# Patient Record
Sex: Female | Born: 1935 | Race: White | Hispanic: No | Smoking: Never smoker
Health system: Southern US, Community
[De-identification: ages and names within clinical notes are randomized; demographics above are authoritative.]

## PROBLEM LIST (undated history)

## (undated) DIAGNOSIS — L03115 Cellulitis of right lower limb: Secondary | ICD-10-CM

## (undated) DIAGNOSIS — I10 Essential (primary) hypertension: Secondary | ICD-10-CM

## (undated) DIAGNOSIS — K219 Gastro-esophageal reflux disease without esophagitis: Secondary | ICD-10-CM

## (undated) DIAGNOSIS — R269 Unspecified abnormalities of gait and mobility: Secondary | ICD-10-CM

## (undated) DIAGNOSIS — F0390 Unspecified dementia without behavioral disturbance: Secondary | ICD-10-CM

## (undated) DIAGNOSIS — M1990 Unspecified osteoarthritis, unspecified site: Secondary | ICD-10-CM

## (undated) DIAGNOSIS — L03116 Cellulitis of left lower limb: Secondary | ICD-10-CM

## (undated) DIAGNOSIS — E785 Hyperlipidemia, unspecified: Secondary | ICD-10-CM

## (undated) DIAGNOSIS — I509 Heart failure, unspecified: Secondary | ICD-10-CM

## (undated) HISTORY — PX: JOINT REPLACEMENT: SHX530

---

## 2005-02-22 ENCOUNTER — Ambulatory Visit: Payer: Self-pay | Admitting: Internal Medicine

## 2005-05-08 ENCOUNTER — Ambulatory Visit: Payer: Self-pay | Admitting: Internal Medicine

## 2006-04-23 ENCOUNTER — Ambulatory Visit: Payer: Self-pay | Admitting: Internal Medicine

## 2006-08-26 ENCOUNTER — Ambulatory Visit: Payer: Self-pay | Admitting: Internal Medicine

## 2007-01-14 ENCOUNTER — Encounter: Payer: Self-pay | Admitting: Internal Medicine

## 2007-01-14 LAB — CONVERTED CEMR LAB
AST: 19 units/L (ref 0–37)
Albumin ELP: 58.9 % (ref 55.8–66.1)
Albumin: 3.8 g/dL (ref 3.5–5.2)
Alkaline Phosphatase: 54 units/L (ref 39–117)
Alpha-1-Globulin: 6.1 % — ABNORMAL HIGH (ref 2.9–4.9)
Alpha-2-Globulin: 13.6 % — ABNORMAL HIGH (ref 7.1–11.8)
Basophils Absolute: 0.1 10*3/uL (ref 0.0–0.1)
Basophils Relative: 0.9 % (ref 0.0–1.0)
Beta Globulin: 6.7 % (ref 4.7–7.2)
Bilirubin, Direct: 0.1 mg/dL (ref 0.0–0.3)
CO2: 31 meq/L (ref 19–32)
Chloride: 97 meq/L (ref 96–112)
Eosinophils Absolute: 0.4 10*3/uL (ref 0.0–0.6)
GFR calc non Af Amer: 73 mL/min
Gamma Globulin: 10.3 % — ABNORMAL LOW (ref 11.1–18.8)
Glucose, Bld: 100 mg/dL — ABNORMAL HIGH (ref 70–99)
HCT: 34.6 % — ABNORMAL LOW (ref 36.0–46.0)
HDL: 57.6 mg/dL (ref >39.0)
Hemoglobin: 11.7 g/dL — ABNORMAL LOW (ref 12.0–15.0)
IgA: 231 mg/dL (ref 68–378)
Lymphocytes Relative: 25.6 % (ref 12.0–46.0)
Monocytes Relative: 8.5 % (ref 3.0–11.0)
Neutrophils Relative %: 60.4 % (ref 43.0–77.0)
RBC: 3.6 M/uL — ABNORMAL LOW (ref 3.87–5.11)
Sed Rate: 37 mm/hr — ABNORMAL HIGH (ref 0–25)
Total Bilirubin: 0.7 mg/dL (ref 0.3–1.2)
Total CHOL/HDL Ratio: 3
VLDL: 27 mg/dL (ref 0–40)

## 2007-06-15 ENCOUNTER — Ambulatory Visit: Payer: Self-pay | Admitting: Internal Medicine

## 2007-06-16 ENCOUNTER — Inpatient Hospital Stay (HOSPITAL_COMMUNITY): Admission: EM | Admit: 2007-06-16 | Discharge: 2007-06-20 | Payer: Self-pay | Admitting: Emergency Medicine

## 2007-06-19 ENCOUNTER — Ambulatory Visit: Payer: Self-pay | Admitting: Physical Medicine & Rehabilitation

## 2007-08-11 ENCOUNTER — Ambulatory Visit: Payer: Self-pay | Admitting: Internal Medicine

## 2007-08-14 DIAGNOSIS — I10 Essential (primary) hypertension: Secondary | ICD-10-CM | POA: Insufficient documentation

## 2007-08-14 DIAGNOSIS — E785 Hyperlipidemia, unspecified: Secondary | ICD-10-CM

## 2007-08-14 DIAGNOSIS — R609 Edema, unspecified: Secondary | ICD-10-CM

## 2007-08-14 DIAGNOSIS — M1990 Unspecified osteoarthritis, unspecified site: Secondary | ICD-10-CM

## 2007-08-25 ENCOUNTER — Encounter: Payer: Self-pay | Admitting: Internal Medicine

## 2007-09-03 ENCOUNTER — Ambulatory Visit: Payer: Self-pay | Admitting: Internal Medicine

## 2007-09-03 DIAGNOSIS — M25519 Pain in unspecified shoulder: Secondary | ICD-10-CM | POA: Insufficient documentation

## 2007-11-28 ENCOUNTER — Telehealth: Payer: Self-pay | Admitting: Internal Medicine

## 2007-12-20 ENCOUNTER — Encounter: Payer: Self-pay | Admitting: *Deleted

## 2007-12-20 DIAGNOSIS — R32 Unspecified urinary incontinence: Secondary | ICD-10-CM | POA: Insufficient documentation

## 2007-12-31 ENCOUNTER — Ambulatory Visit: Payer: Self-pay | Admitting: Internal Medicine

## 2007-12-31 LAB — CONVERTED CEMR LAB
BUN: 17 mg/dL (ref 6–23)
Calcium: 9.9 mg/dL (ref 8.4–10.5)
Cholesterol: 217 mg/dL (ref 0–200)
Creatinine, Ser: 0.9 mg/dL (ref 0.4–1.2)
HDL: 58.8 mg/dL (ref >39.0)
Sodium: 136 meq/L (ref 135–145)
Total CHOL/HDL Ratio: 3.7
Total Protein: 6.8 g/dL (ref 6.0–8.3)
Triglycerides: 113 mg/dL (ref 0–149)
VLDL: 23 mg/dL (ref 0–40)

## 2008-01-03 ENCOUNTER — Encounter: Payer: Self-pay | Admitting: Internal Medicine

## 2008-08-05 ENCOUNTER — Ambulatory Visit: Payer: Self-pay | Admitting: Internal Medicine

## 2008-08-05 LAB — CONVERTED CEMR LAB
BUN: 22 mg/dL (ref 6–23)
Basophils Absolute: 0 10*3/uL (ref 0.0–0.1)
Basophils Relative: 0.7 % (ref 0.0–3.0)
CO2: 31 meq/L (ref 19–32)
Calcium: 9.3 mg/dL (ref 8.4–10.5)
Chloride: 100 meq/L (ref 96–112)
Cholesterol: 277 mg/dL (ref 0–200)
Creatinine, Ser: 1.2 mg/dL (ref 0.4–1.2)
Eosinophils Absolute: 0.2 10*3/uL (ref 0.0–0.7)
Eosinophils Relative: 2.9 % (ref 0.0–5.0)
GFR calc Af Amer: 55 mL/min
HCT: 30.4 % — ABNORMAL LOW (ref 36.0–46.0)
Lymphocytes Relative: 19.2 % (ref 12.0–46.0)
MCHC: 33.8 g/dL (ref 30.0–36.0)
Monocytes Absolute: 0.6 10*3/uL (ref 0.1–1.0)
Monocytes Relative: 9.4 % (ref 3.0–12.0)
Neutro Abs: 4.4 10*3/uL (ref 1.4–7.7)
Platelets: 316 10*3/uL (ref 150–400)
Potassium: 4.6 meq/L (ref 3.5–5.1)
RDW: 11.6 % (ref 11.5–14.6)
Total CHOL/HDL Ratio: 5.7
WBC: 6.4 10*3/uL (ref 4.5–10.5)

## 2008-08-08 ENCOUNTER — Encounter: Payer: Self-pay | Admitting: Internal Medicine

## 2008-09-15 ENCOUNTER — Telehealth: Payer: Self-pay | Admitting: Internal Medicine

## 2008-12-14 ENCOUNTER — Ambulatory Visit: Payer: Self-pay | Admitting: Internal Medicine

## 2008-12-14 DIAGNOSIS — J019 Acute sinusitis, unspecified: Secondary | ICD-10-CM

## 2008-12-14 DIAGNOSIS — R252 Cramp and spasm: Secondary | ICD-10-CM

## 2008-12-14 DIAGNOSIS — D649 Anemia, unspecified: Secondary | ICD-10-CM

## 2008-12-14 DIAGNOSIS — H1030 Unspecified acute conjunctivitis, unspecified eye: Secondary | ICD-10-CM | POA: Insufficient documentation

## 2009-01-17 ENCOUNTER — Ambulatory Visit: Payer: Self-pay | Admitting: Internal Medicine

## 2009-01-17 DIAGNOSIS — H906 Mixed conductive and sensorineural hearing loss, bilateral: Secondary | ICD-10-CM | POA: Insufficient documentation

## 2009-01-17 LAB — CONVERTED CEMR LAB
Albumin: 4 g/dL (ref 3.5–5.2)
Alkaline Phosphatase: 54 units/L (ref 39–117)
BUN: 21 mg/dL (ref 6–23)
CO2: 26 meq/L (ref 19–32)
Calcium: 9.7 mg/dL (ref 8.4–10.5)
Creatinine, Ser: 1.3 mg/dL — ABNORMAL HIGH (ref 0.4–1.2)
GFR calc non Af Amer: 41.66 mL/min (ref >60)
LDL Cholesterol: 108 mg/dL — ABNORMAL HIGH (ref 0–99)
Potassium: 4.7 meq/L (ref 3.5–5.1)
Sodium: 137 meq/L (ref 135–145)
Triglycerides: 133 mg/dL (ref 0.0–149.0)

## 2009-01-23 ENCOUNTER — Encounter: Payer: Self-pay | Admitting: Internal Medicine

## 2009-02-07 ENCOUNTER — Encounter: Payer: Self-pay | Admitting: Internal Medicine

## 2009-04-11 ENCOUNTER — Encounter: Payer: Self-pay | Admitting: Internal Medicine

## 2009-08-16 ENCOUNTER — Telehealth: Payer: Self-pay | Admitting: Internal Medicine

## 2010-01-23 ENCOUNTER — Ambulatory Visit: Payer: Self-pay | Admitting: Internal Medicine

## 2010-01-23 LAB — CONVERTED CEMR LAB
BUN: 22 mg/dL (ref 6–23)
Creatinine, Ser: 1.2 mg/dL (ref 0.4–1.2)
Eosinophils Absolute: 0.2 10*3/uL (ref 0.0–0.7)
Glucose, Bld: 98 mg/dL (ref 70–99)
Lymphocytes Relative: 27.7 % (ref 12.0–46.0)
Lymphs Abs: 2.1 10*3/uL (ref 0.7–4.0)
MCHC: 35.2 g/dL (ref 30.0–36.0)
MCV: 96.9 fL (ref 78.0–100.0)
Monocytes Absolute: 0.6 10*3/uL (ref 0.1–1.0)
Neutro Abs: 4.7 10*3/uL (ref 1.4–7.7)
Potassium: 5.1 meq/L (ref 3.5–5.1)
Sodium: 141 meq/L (ref 135–145)
TSH: 0.39 microintl units/mL (ref 0.35–5.50)

## 2010-03-30 ENCOUNTER — Ambulatory Visit: Payer: Self-pay | Admitting: Internal Medicine

## 2010-03-30 ENCOUNTER — Inpatient Hospital Stay (HOSPITAL_COMMUNITY): Admission: EM | Admit: 2010-03-30 | Discharge: 2010-04-04 | Payer: Self-pay | Admitting: Emergency Medicine

## 2010-04-03 ENCOUNTER — Ambulatory Visit: Payer: Self-pay | Admitting: Internal Medicine

## 2010-04-04 ENCOUNTER — Encounter: Payer: Self-pay | Admitting: Internal Medicine

## 2010-04-07 ENCOUNTER — Encounter: Payer: Self-pay | Admitting: Internal Medicine

## 2010-04-22 ENCOUNTER — Ambulatory Visit: Payer: Self-pay | Admitting: Internal Medicine

## 2010-04-27 ENCOUNTER — Ambulatory Visit: Payer: Self-pay | Admitting: Internal Medicine

## 2010-04-27 DIAGNOSIS — E538 Deficiency of other specified B group vitamins: Secondary | ICD-10-CM

## 2010-07-13 ENCOUNTER — Telehealth: Payer: Self-pay | Admitting: Internal Medicine

## 2010-07-25 ENCOUNTER — Ambulatory Visit: Payer: Self-pay | Admitting: Internal Medicine

## 2010-07-25 DIAGNOSIS — I509 Heart failure, unspecified: Secondary | ICD-10-CM | POA: Insufficient documentation

## 2010-07-25 LAB — CONVERTED CEMR LAB
BUN: 28 mg/dL — ABNORMAL HIGH (ref 6–23)
Basophils Absolute: 0 10*3/uL (ref 0.0–0.1)
Basophils Relative: 0.3 % (ref 0.0–3.0)
CO2: 27 meq/L (ref 19–32)
Calcium: 9.5 mg/dL (ref 8.4–10.5)
Folate: 11.4 ng/mL
GFR calc non Af Amer: 35.46 mL/min (ref >60)
HCT: 28.5 % — ABNORMAL LOW (ref 36.0–46.0)
Hemoglobin: 9.8 g/dL — ABNORMAL LOW (ref 12.0–15.0)
Iron: 101 ug/dL (ref 42–145)
MCV: 98.7 fL (ref 78.0–100.0)
Neutrophils Relative %: 67.6 % (ref 43.0–77.0)
RBC: 2.89 M/uL — ABNORMAL LOW (ref 3.87–5.11)
RDW: 13.4 % (ref 11.5–14.6)
Saturation Ratios: 35 % (ref 20.0–50.0)
Sodium: 136 meq/L (ref 135–145)
Transferrin: 206.1 mg/dL — ABNORMAL LOW (ref 212.0–360.0)
Vitamin B-12: 373 pg/mL (ref 211–911)

## 2010-07-26 ENCOUNTER — Encounter: Payer: Self-pay | Admitting: Internal Medicine

## 2010-09-20 ENCOUNTER — Ambulatory Visit: Payer: Self-pay | Admitting: Internal Medicine

## 2010-11-16 NOTE — Procedures (Signed)
Summary: Upper Endoscopy  Patient: [name] Note: All result statuses are Final unless otherwise noted.  Tests: (1) Upper Endoscopy (EGD)   EGD Upper Endoscopy       DONE     [facility]     [address] [place], [place]  [address]           ENDOSCOPY PROCEDURE REPORT           PATIENT:  [name], [name]  MR#:  [id number]     BIRTHDATE:  Jul 26, 1925, 85 yrs. old  GENDER:  female           ENDOSCOPIST:  Wilhemina Bonito. Eda Keys, MD     Referred by:  Rosalyn Gess. Norins, M.D.           PROCEDURE DATE:  04/04/2010     PROCEDURE:  EGD, diagnostic     ASA CLASS:  Class II     INDICATIONS:  iron deficiency anemia, GERD           MEDICATIONS:   There was residual sedation effect present from     prior procedure., Versed 1 mg IV     TOPICAL ANESTHETIC:  Cetacaine Spray           DESCRIPTION OF PROCEDURE:   After the risks benefits and     alternatives of the procedure were thoroughly explained, informed     consent was obtained.  The Gastroscope [id number]     endoscope was introduced through the mouth and advanced to the     third portion of the duodenum, without limitations.  The     instrument was slowly withdrawn as the mucosa was fully examined.     <<PROCEDUREIMAGES>>           Severe erosive Esophagitis was found in the distal 1/2 of the     esophagus.  A benign  stricture was found in the distal esophagus.     A 4cm hiatal hernia was found.  Atrophic gastric mucosa noted.     Otherwise the examination was normal to D3.    Retroflexed views     revealed the hiatal hernia.    The scope was then withdrawn from     the patient and the procedure completed.           COMPLICATIONS:  None           ENDOSCOPIC IMPRESSION:     1) Erosive Esophagitis in the distal 1/2 of the esophagus (could     explain iron deficiency)     2) Stricture in the distal esophagus     3) Hiatal hernia     4) Atrophic gastric mucosa     5) Otherwise normal examination     6) GERD        RECOMMENDATIONS:     1) PPI qam  indefinitely(Protonix 40mg  daily or equivalent).     Stop Ranitidine     2) Anti-reflux regimen to be followed     3) iron supplement           ______________________________     Wilhemina Bonito. Eda Keys, MD           CC:  Jacques Navy, MD, The Patient           n.     Rosalie DoctorWilhemina Bonito. Eda Keys at 04/04/2010 10:50 AM           [name],  [id number]  Note: An exclamation mark (!) indicates a result that was not dispersed into the flowsheet. Document Creation Date: 04/04/2010 10:51 AM _______________________________________________________________________  (1) Order result status: Final Collection or observation date-time: 04/04/2010 10:42 Requested date-time:  Receipt date-time:  Reported date-time:  Referring Physician:   Ordering Physician: Fransico Setters [phone]) Specimen Source:  Source: Launa Grill Order Number: [id number] Lab site:

## 2010-11-16 NOTE — Miscellaneous (Signed)
Summary: Order/[facility]  Order/[facility]   Imported By: Lester [place] 04/13/2010 09:12:10  _____________________________________________________________________  External Attachment:    Type:   Image     Comment:   External Document

## 2010-11-16 NOTE — Miscellaneous (Signed)
Summary: Plan/[facility]  Plan/[facility]   Imported By: Lester [place] 04/26/2010 08:11:57  _____________________________________________________________________  External Attachment:    Type:   Image     Comment:   External Document

## 2010-11-16 NOTE — Letter (Signed)
[facility] [address] [place], [place]  [address] Phone: [phone]      August 03, 2010   [facility] [address] [place], [place] [address]  RE:  LAB RESULTS  Dear  [name],  The following is an interpretation of your most recent lab tests.  Please take note of any instructions provided or changes to medications that have resulted from your lab work.  ELECTROLYTES:  Good - no changes needed  KIDNEY FUNCTION TESTS:  Stable - no changes needed    DIABETIC STUDIES:  Good - no changes needed Blood Glucose: 105    CBC:  Stable - no changes needed B12 in normal range. Iron stores in normal range.    Looks pretty good. You are still anemic but can hold off on iron tablets and B12 for now. Will need repeat lab in 60 days. If there is not improvement in  anemia may need a hematology consult.  Please come see me if you have any questions about these lab results.   Sincerely Yours,    Jacques Navy MD  Patient: [name] Note: All result statuses are Final unless otherwise noted.  Tests: (1) B12 + Folate Panel (B12/FOL)   Vitamin B12               373 pg/mL                   211-911   Folate                    11.4 ng/mL     Deficient  0.4 - 3.4 ng/mL     Indeterminate  3.4 - 5.4 ng/mL     Normal  >5.4 ng/mL  Tests: (2) CBC Platelet w/Diff (CBCD)   White Cell Count          7.0 K/uL                    4.5-10.5   Red Cell Count       [L]  2.89 Mil/uL                 3.87-5.11   Hemoglobin           [L]  9.8 g/dL                    29.5-62.1   Hematocrit           [L]  28.5 %                      36.0-46.0   MCV                       98.7 fl                     78.0-100.0   MCHC                      34.5 g/dL                   30.8-65.7   RDW                       13.4 %                      11.5-14.6   Platelet Count  348.0 K/uL                  150.0-400.0   Neutrophil %              67.6 %                       43.0-77.0   Lymphocyte %              22.2 %                      12.0-46.0   Monocyte %                7.7 %                       3.0-12.0   Eosinophils%              2.2 %                       0.0-5.0   Basophils %               0.3 %                       0.0-3.0   Neutrophill Absolute      4.8 K/uL                    1.4-7.7   Lymphocyte Absolute       1.6 K/uL                    0.7-4.0   Monocyte Absolute         0.5 K/uL                    0.1-1.0  Eosinophils, Absolute                             0.2 K/uL                    0.0-0.7   Basophils Absolute        0.0 K/uL                    0.0-0.1  Tests: (3) IBC Panel (IBC)   Iron                      101 ug/dL                   16-109   Transferrin          [L]  206.1 mg/dL                 604.5-409.8   Iron Saturation           35.0 %                      20.0-50.0  Tests: (4) BMP (METABOL)   Sodium                    136 mEq/L                   135-145   Potassium  4.9 mEq/L                   3.5-5.1   Chloride                  98 mEq/L                    96-112   Carbon Dioxide            27 mEq/L                    19-32   Glucose              [H]  105 mg/dL                   13-08   BUN                  [H]  28 mg/dL                    6-57   Creatinine           [H]  1.5 mg/dL                   8.4-6.9   Calcium                   9.5 mg/dL                   6.2-95.2   GFR                       35.46 mL/min                >60

## 2010-11-16 NOTE — Procedures (Signed)
Summary: Endoscopy Procedure Report / [facility]  Endoscopy Procedure Report / [facility]   Imported By: Lennie Odor 04/12/2010 15:09:14  _____________________________________________________________________  External Attachment:    Type:   Image     Comment:   External Document

## 2010-11-16 NOTE — Procedures (Signed)
Summary: Colon Procedure Report / [facility]  Colon Procedure Report / [facility]   Imported By: Lennie Odor 04/12/2010 14:54:02  _____________________________________________________________________  External Attachment:    Type:   Image     Comment:   External Document

## 2010-11-16 NOTE — Assessment & Plan Note (Signed)
Summary: FU--STC   Vital Signs:  Patient profile:   77 year old female Height:      64 inches Weight:      205 pounds BMI:     35.32 O2 Sat:      97 % on Room air Temp:     98.0 degrees F oral Pulse rate:   69 / minute BP sitting:   114 / 78  (left arm) Cuff size:   regular  Vitals Entered By: Bill Salinas CMA (January 23, 2010 2:01 PM)  O2 Flow:  Room air CC: pt here for a follow up office visit to get refills on medications/ ab   Primary Care Provider:  Ferrah Panagopoulos  CC:  pt here for a follow up office visit to get refills on medications/ ab.  History of Present Illness: Patient presents for routine medical follow-up with her last visit April 2010. IN the interval she has been doing well. she has bee hearing tested and has new hearing aids. She has no specific complaints and has had no intervening illness. She is concerned about her wrinkles. she did not take the Statin medications due to discomfort.   Current Medications (verified): 1)  Enalapril Maleate 20 Mg  Tabs (Enalapril Maleate) .... Take Two Times A Day 2)  Lasix 40 Mg Tabs (Furosemide) .... Take 1 Tablet By Mouth Once A Day 3)  Lopressor 50 Mg  Tabs (Metoprolol Tartrate) .Marland Kitchen.. 1 Two Times A Day Until Toprol Xl Comes Back 4)  Etodolac Cr 400 Mg  Tb24 (Etodolac) .... Take 1 Tablet By Mouth Two Times A Day 5)  Ranitidine Hcl 150 Mg  Tabs (Ranitidine Hcl) .Marland Kitchen.. 1 Two Times A Day 6)  Metoprolol Succinate 100 Mg Xr24h-Tab (Metoprolol Succinate) .... Take 1 Tablet By Mouth Once A Day  Allergies (verified): No Known Drug Allergies  Past History:  Past Medical History: Last updated: 12/14/2008 Hx of URINARY INCONTINENCE, MILD (ICD-788.30) SHOULDER PAIN (ICD-719.41) PERIPHERAL EDEMA (ICD-782.3) OSTEOARTHRITIS (ICD-715.90) HYPERTENSION (ICD-401.9) HYPERLIPIDEMIA (ICD-272. Anemia-NOS  Past Surgical History: Last updated: 12/20/2007 * ORIF RIGHT ELBOW. Hx of URINARY INCONTINENCE, MILD (ICD-788.30) SHOULDER PAIN  (ICD-719.41) PERIPHERAL EDEMA (ICD-782.3) OSTEOARTHRITIS (ICD-715.90) HYPERTENSION (ICD-401.9) HYPERLIPIDEMIA (ICD-272.4)    Family History: Last updated: 09/03/2007 non-contributory  Social History: Last updated: 09/03/2007 widowed  2002, after 52 years of marriage 1 daughter, 1 son - both lives close enough to check on her regularly  Review of Systems  The patient denies anorexia, fever, weight loss, weight gain, vision loss, decreased hearing, chest pain, syncope, dyspnea on exertion, prolonged cough, headaches, abdominal pain, muscle weakness, suspicious skin lesions, depression, abnormal bleeding, and angioedema.    Physical Exam  General:  alert, well-developed, well-nourished, and well-hydrated, overweight.   Head:  normocephalic and atraumatic.   Eyes:  vision grossly intact, pupils equal, pupils round, and corneas and lenses clear.   Ears:  R ear normal and L ear normal.   Mouth:  no oral lesions Neck:  supple and full ROM.   Lungs:  normal respiratory effort, no intercostal retractions, no accessory muscle use, normal breath sounds, and no wheezes.   Heart:  normal rate, regular rhythm, no murmur, and no JVD.   Abdomen:  soft, non-tender, and normal bowel sounds.   Msk:  EWalks with walker around the house and gets to the front porch. No joint inflammation.  Pulses:  2+ radial  Neurologic:  alert & oriented X3, cranial nerves II-XII intact, strength normal in all extremities, gait normal, and DTRs symmetrical  and normal.   Skin:  chronic skin changes with erythema both distal LE. NO heat. NO skin  breakdown. Cervical Nodes:  no anterior cervical adenopathy and no posterior cervical adenopathy.   Psych:  Oriented X3, memory intact for recent and remote, normally interactive, and good eye contact.     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   Orders: TLB-CBC Platelet - w/Differential (85025-CBCD)  Hgb 10.1 stable  Problem # 3:  PERIPHERAL EDEMA (ICD-782.3) No pitting edema.   Plan - routine lab to check renal function.  Her updated medication list for this problem includes:    Lasix 40 Mg Tabs (Furosemide) .Marland Kitchen... Take 1 tablet by mouth once a day  Orders: TLB-BMP (Basic Metabolic Panel-BMET) (80048-METABOL)  Addendum - renal function OK  Problem # 4:  HYPERTENSION (ICD-401.9)  Her updated medication list for this problem includes:    Enalapril Maleate 20 Mg Tabs (Enalapril maleate) .Marland Kitchen... Take two times a day    Lasix 40 Mg Tabs (Furosemide) .Marland Kitchen... Take 1 tablet by mouth once a day    Lopressor 50 Mg Tabs (Metoprolol tartrate) .Marland Kitchen... 1 two times a day until toprol xl comes back    Metoprolol Succinate 100 Mg Xr24h-tab (Metoprolol succinate) .Marland Kitchen... Take 1 tablet by mouth once a day  Orders: TLB-BMP (Basic Metabolic Panel-BMET) (80048-METABOL)  BP today: 114/78 Prior BP: 136/64 (01/17/2009)  Good control. Lytes normal  Problem # 5:  HYPERLIPIDEMIA (ICD-272.4) Patient has a baseline of LDL that is very high. she is intolerant of statins  Plan - trail of cholestyramine 4g once daily x 7, then two times a day.            follow-up lab in 12 weeks.   Her updated medication list for this problem includes:    Cholestyramine Light 4 Gm Pack (Cholestyramine light) .Marland Kitchen... 1 packet in water two times a day for cholesterol  Orders: TLB-TSH (Thyroid Stimulating Hormone) (84443-TSH)  Problem # 6:  Preventive Health Care (ICD-V70.0) Patient reports doing well. She declines mammogram.   In summary - a very nice woman who has multiple medical problems but seems stable. She will return in 6 months.   Complete Medication List: 1)  Enalapril Maleate 20 Mg Tabs (Enalapril maleate) .... Take two times a day 2)  Lasix 40 Mg Tabs (Furosemide) .... Take 1 tablet by mouth once a day 3)  Lopressor 50 Mg Tabs  (Metoprolol tartrate) .Marland Kitchen.. 1 two times a day until toprol xl comes back 4)  Etodolac Cr 400 Mg Tb24 (Etodolac) .... Take 1 tablet by mouth two times a day 5)  Ranitidine Hcl 150 Mg Tabs (Ranitidine hcl) .Marland Kitchen.. 1 two times a day 6)  Metoprolol Succinate 100 Mg Xr24h-tab (Metoprolol succinate) .... Take 1 tablet by mouth once a day 7)  Cholestyramine Light 4 Gm Pack (Cholestyramine light) .Marland Kitchen.. 1 packet in water two times a day for cholesterol  Patient: [name] Note: All result statuses are Final unless otherwise noted.  Tests: (1) CBC Platelet w/Diff (CBCD)   White Cell Count          7.7 K/uL                    4.5-10.5   Red Cell Count       [L]  2.96 Mil/uL  3.87-5.11   Hemoglobin           [L]  10.1 g/dL                   19.1-47.8   Hematocrit           [L]  28.7 %                      36.0-46.0   MCV                       96.9 fl                     78.0-100.0   MCHC                      35.2 g/dL                   29.5-62.1   RDW                       13.5 %                      11.5-14.6   Platelet Count            322.0 K/uL                  150.0-400.0   Neutrophil %              60.7 %                      43.0-77.0   Lymphocyte %              27.7 %                      12.0-46.0   Monocyte %                8.1 %                       3.0-12.0   Eosinophils%              3.1 %                       0.0-5.0   Basophils %               0.4 %                       0.0-3.0   Neutrophill Absolute      4.7 K/uL                    1.4-7.7   Lymphocyte Absolute       2.1 K/uL                    0.7-4.0   Monocyte Absolute         0.6 K/uL                    0.1-1.0  Eosinophils, Absolute                             0.2 K/uL  0.0-0.7   Basophils Absolute        0.0 K/uL                    0.0-0.1  Tests: (2) BMP (METABOL)   Sodium                    141 mEq/L                   135-145   Potassium                 5.1 mEq/L                   3.5-5.1    Chloride                  103 mEq/L                   96-112   Carbon Dioxide            31 mEq/L                    19-32   Glucose                   98 mg/dL                    08-65   BUN                       22 mg/dL                    7-84   Creatinine                1.2 mg/dL                   6.9-6.2   Calcium                   9.2 mg/dL                   9.5-28.4   GFR                       45.58 mL/min                >60  Tests: (3) TSH (TSH)   FastTSH                   0.39 uIU/mL                 0.35-5.50Prescriptions: CHOLESTYRAMINE LIGHT 4 GM PACK (CHOLESTYRAMINE LIGHT) 1 packet in water two times a day for cholesterol  #60 x 1   Entered and Authorized by:   Jacques Navy MD   Signed by:   Jacques Navy MD on 01/23/2010   Method used:   Electronically to        [facility] [phone]* (retail)       [address] [place], [place]  [address]       Ph: [phone] or [phone]       Fax: [phone]   RxID:   [id number] METOPROLOL SUCCINATE 100 MG XR24H-TAB (METOPROLOL SUCCINATE) Take 1 tablet by mouth once a day  #30 Tablet x 12   Entered and Authorized by:  Jacques Navy MD   Signed by:   Jacques Navy MD on 01/23/2010   Method used:   Electronically to        [facility] [phone]* (retail)       [address] [place], [place]  [address]       Ph: [phone] or [phone]       Fax: [phone]   RxID:   [id number] RANITIDINE HCL 150 MG  TABS (RANITIDINE HCL) 1 two times a day  #60 Tablet x 12   Entered and Authorized by:   Jacques Navy MD   Signed by:   Jacques Navy MD on 01/23/2010   Method used:   Electronically to        [facility] [phone]* (retail)       [address] [place], [place]  [address]       Ph: [phone] or [phone]       Fax: [phone]   RxID:   [id number] ETODOLAC CR 400 MG  TB24 (ETODOLAC) Take 1 tablet by mouth two times a day  #62 x 12   Entered and Authorized by:    Jacques Navy MD   Signed by:   Jacques Navy MD on 01/23/2010   Method used:   Electronically to        [facility] [phone]* (retail)       [address] [place], [place]  [address]       Ph: [phone] or [phone]       Fax: [phone]   RxID:   [id number] LASIX 40 MG TABS (FUROSEMIDE) Take 1 tablet by mouth once a day  #30 Tablet x 12   Entered and Authorized by:   Jacques Navy MD   Signed by:   Jacques Navy MD on 01/23/2010   Method used:   Electronically to        [facility] [phone]* (retail)       [address] [place], [place]  [address]       Ph: [phone] or [phone]       Fax: [phone]   RxID:   [id number] ENALAPRIL MALEATE 20 MG  TABS (ENALAPRIL MALEATE) Take two times a day  #60 Tablet x 12   Entered and Authorized by:   Jacques Navy MD   Signed by:   Jacques Navy MD on 01/23/2010   Method used:   Electronically to        [facility] [phone]* (retail)       [address] [place], [place]  [address]       Ph: [phone] or [phone]       Fax: [phone]   RxID:   [id number]

## 2010-11-16 NOTE — Assessment & Plan Note (Signed)
Summary: 2-3 WK POST HOSP PER DAU/MARIE WADE-[facility]C   Vital Signs:  Patient profile:   77 year old female Height:      64 inches Weight:      203 pounds BMI:     34.97 O2 Sat:      97 % on Room air Temp:     97.5 degrees F oral Pulse rate:   65 / minute BP sitting:   122 / 62  (left arm) Cuff size:   regular  Vitals Entered By: Bill Salinas CMA (April 27, 2010 11:35 AM)  O2 Flow:  Room air   Primary Care Provider:  Norins   History of Present Illness: See hospital d/c summary. She has been getting home health PT and OT. She has done well. She is walking well with walker. Has modified some of her furniture so that she has arms on the chairs.   Current Medications (verified): 1)  Enalapril Maleate 20 Mg  Tabs (Enalapril Maleate) .... Take Two Times A Day 2)  Lasix 40 Mg Tabs (Furosemide) .... Take 1 Tablet By Mouth Once A Day 3)  Lopressor 50 Mg  Tabs (Metoprolol Tartrate) .Marland Kitchen.. 1 Two Times A Day Until Toprol Xl Comes Back 4)  Etodolac Cr 400 Mg  Tb24 (Etodolac) .... Take 1 Tablet By Mouth Two Times A Day 5)  Ranitidine Hcl 150 Mg  Tabs (Ranitidine Hcl) .Marland Kitchen.. 1 Two Times A Day 6)  Metoprolol Succinate 100 Mg Xr24h-Tab (Metoprolol Succinate) .... Take 1 Tablet By Mouth Once A Day 7)  Cholestyramine Light 4 Gm Pack (Cholestyramine Light) .Marland Kitchen.. 1 Packet in Water Two Times A Day For Cholesterol 8)  Omeprazole 40 Mg Cpdr (Omeprazole) .Marland Kitchen.. 1 Qm 9)  Ferrous Sulfate 325 (65 Fe) Mg Tabs (Ferrous Sulfate) .Marland Kitchen.. 1 Tab Two Times A Day  Allergies (verified): No Known Drug Allergies PMH-FH-SH reviewed-no changes except otherwise noted  Review of Systems       The patient complains of peripheral edema, muscle weakness, and difficulty walking.  The patient denies weight loss, weight gain, chest pain, dyspnea on exertion, prolonged cough, abdominal pain, abnormal bleeding, and enlarged lymph nodes.    Physical Exam  General:  obese white female in no distress Eyes:  C&S clear Lungs:  normal  respiratory effort, normal breath sounds, no crackles, and no wheezes.   Heart:  normal rate and regular rhythm.   Msk:  no joint tenderness and no redness over joints.  Both knees chronically swollen. Pulses:  2+ radial Neurologic:  alert & oriented X3 and cranial nerves II-XII intact.     Impression & Recommendations:  Problem # 1:  ANEMIA-NOS (ICD-285.9) Stable. She did have endoscopy in hospital - clear colon.  Continue iron replacement. Lab in September  Her updated medication list for this problem includes:    Ferrous Sulfate 325 (65 Fe) Mg Tabs (Ferrous sulfate) .Marland Kitchen... 1 tab two times a day  Problem # 2:  HYPERTENSION (ICD-401.9)  Her updated medication list for this problem includes:    Enalapril Maleate 20 Mg Tabs (Enalapril maleate) .Marland Kitchen... Take two times a day    Lasix 40 Mg Tabs (Furosemide) .Marland Kitchen... Take 1 tablet by mouth once a day    Lopressor 50 Mg Tabs (Metoprolol tartrate) .Marland Kitchen... 1 two times a day until toprol xl comes back    Metoprolol Succinate 100 Mg Xr24h-tab (Metoprolol succinate) .Marland Kitchen... Take 1 tablet by mouth once a day  BP today: 122/62 Prior BP: 114/78 (01/23/2010)  Over treated  with low BP  Plan - reduce enalapril to 20mg  once daily   Problem # 3:  RHABDOMYOLYSIS (ICD-728.88) Resolved. Will follow-up renal function in September.  Problem # 4:  VITAMIN B12 DEFICIENCY (ICD-266.2) Diagnosed in hospital. ON Nascobal weekly (not using correctly but will).  Plan - B12 level in September.   Complete Medication List: 1)  Enalapril Maleate 20 Mg Tabs (Enalapril maleate) .... Take two times a day 2)  Lasix 40 Mg Tabs (Furosemide) .... Take 1 tablet by mouth once a day 3)  Lopressor 50 Mg Tabs (Metoprolol tartrate) .Marland Kitchen.. 1 two times a day until toprol xl comes back 4)  Etodolac Cr 400 Mg Tb24 (Etodolac) .... Take 1 tablet by mouth two times a day 5)  Ranitidine Hcl 150 Mg Tabs (Ranitidine hcl) .Marland Kitchen.. 1 two times a day 6)  Metoprolol Succinate 100 Mg Xr24h-tab  (Metoprolol succinate) .... Take 1 tablet by mouth once a day 7)  Cholestyramine Light 4 Gm Pack (Cholestyramine light) .Marland Kitchen.. 1 packet in water two times a day for cholesterol 8)  Omeprazole 40 Mg Cpdr (Omeprazole) .Marland Kitchen.. 1 qm 9)  Ferrous Sulfate 325 (65 Fe) Mg Tabs (Ferrous sulfate) .Marland Kitchen.. 1 tab two times a day   Immunization History:  Tetanus/Td Immunization History:    Tetanus/Td:  historical (12/18/2007)  Influenza Immunization History:    Influenza:  historical (07/16/2009)  Pneumovax Immunization History:    Pneumovax:  historical (12/15/2007)

## 2010-11-16 NOTE — Procedures (Signed)
Summary: Colonoscopy  Patient: [name] Note: All result statuses are Final unless otherwise noted.  Tests: (1) Colonoscopy (COL)   COL Colonoscopy           DONE     [facility]     [address] [place], [place]  [address]           COLONOSCOPY PROCEDURE REPORT           PATIENT:  Olivia, Duran  MR#:  [id number]     BIRTHDATE:  [birth year], 85 yrs. old  GENDER:  female     ENDOSCOPIST:  Wilhemina Bonito. Eda Keys, MD     REF. BY:  Rosalyn Gess. Norins, M.D.     PROCEDURE DATE:  04/04/2010     PROCEDURE:  Average-risk screening colonoscopy     G0121     ASA CLASS:  Class II     INDICATIONS:  screening, anemia     MEDICATIONS:   Fentanyl 75 mcg IV, Versed 7 mg IV           DESCRIPTION OF PROCEDURE:   After the risks benefits and     alternatives of the procedure were thoroughly explained, informed     consent was obtained.  Digital rectal exam was performed and     revealed no abnormalities.   The Pentax Colonoscope [id number]     endoscope was introduced through the anus and advanced to the     cecum, which was identified by the appendix, without     limitations.TIME TO CECUM = 6 MIN. The quality of the prep was     good, using MiraLax.  The instrument was then slowly withdrawn     (TIME = 12 MIN) as the colon was fully examined.     <<PROCEDUREIMAGES>>           FINDINGS:  Moderate diverticulosis was found in the left colon.     This was otherwise a normal examination of the colon.  No polyps or     cancers were seen.   Retroflexed views in the rectum revealed     internal hemorrhoids.    The scope was then withdrawn from the     patient and the procedure completed.           COMPLICATIONS:  None     ENDOSCOPIC IMPRESSION:     1) Moderate diverticulosis in the left colon     2) Otherwise normal examination     3) No polyps or cancers     4) Internal hemorrhoids           RECOMMENDATIONS:     1) Return to the care of your primary provider. GI follow up as      needed           ______________________________     Wilhemina Bonito. Eda Keys, MD           CC:  Jacques Navy, MD; The Patient           n.     eSIGNED:   Wilhemina Bonito. Eda Keys at 04/04/2010 10:25 AM           [name], [id number]  Note: An exclamation mark (!) indicates a result that was not dispersed into the flowsheet. Document Creation Date: 04/04/2010 10:26 AM _______________________________________________________________________  (1) Order result status: Final Collection or observation date-time: 04/04/2010 10:20 Requested date-time:  Receipt date-time:  Reported date-time:  Referring  Physician:   Ordering Physician: Fransico Setters [phone]) Specimen Source:  Source: Launa Grill Order Number: [id number] Lab site:

## 2010-11-16 NOTE — Progress Notes (Signed)
    Immunization History:  Influenza Immunization History:    Influenza:  0.57ml left deltoid im novartis lot# [id number] (07/12/2010)

## 2010-11-16 NOTE — Assessment & Plan Note (Signed)
Summary: 6 mos f/u / # / cd   Vital Signs:  Patient profile:   77 year old female Height:      64 inches (162.56 cm) Weight:      200 pounds (90.91 kg) BMI:     34.45 O2 Sat:      96 % on Room air Temp:     97.8 degrees F (36.56 degrees C) oral Pulse rate:   66 / minute BP sitting:   110 / 72  (left arm) Cuff size:   regular  Vitals Entered By: Brenton Grills MA (July 25, 2010 10:40 AM)  O2 Flow:  Room air CC: Follow-up visit/aj Is Patient Diabetic? No Comments pt is no longer taking Ranitidine or Cholestryamine. Pt is taking Enalapril once daily/aj   Primary Care [place] Os[facility]:  Norins  CC:  Follow-up visit/aj.  History of Present Illness: Patient presents for follow-up accompanied by her daughter who provides additional in-put into her history. In the interval since her last visit she has been doing OK. She is ambulatory with her rolling walker. She has had no problems with breathing, no weakness, no falls and she has been taking her medicines. She has a good  appeitite, good bowel habit. Pain free except for knee pain, which is severe enough to limit her activities. She has had no recurrent symptoms of CHF: no SOB, chest pain, PND . Did review last hospital D/C summary and labs.  Current Medications (verified): 1)  Enalapril Maleate 20 Mg  Tabs (Enalapril Maleate) .... Take Two Times A Day 2)  Lasix 40 Mg Tabs (Furosemide) .... Take 1 Tablet By Mouth Once A Day 3)  Lopressor 50 Mg  Tabs (Metoprolol Tartrate) .Marland Kitchen.. 1 Two Times A Day Until Toprol Xl Comes Back 4)  Etodolac Cr 400 Mg  Tb24 (Etodolac) .... Take 1 Tablet By Mouth Two Times A Day 5)  Ranitidine Hcl 150 Mg  Tabs (Ranitidine Hcl) .Marland Kitchen.. 1 Two Times A Day 6)  Metoprolol Succinate 100 Mg Xr24h-Tab (Metoprolol Succinate) .... Take 1 Tablet By Mouth Once A Day 7)  Cholestyramine Light 4 Gm Pack (Cholestyramine Light) .Marland Kitchen.. 1 Packet in Water Two Times A Day For Cholesterol 8)  Omeprazole 40 Mg Cpdr (Omeprazole) .Marland Kitchen.. 1 Qm 9)   Ferrous Sulfate 325 (65 Fe) Mg Tabs (Ferrous Sulfate) .Marland Kitchen.. 1 Tab Two Times A Day  Allergies (verified): No Known Drug Allergies  Past History:  Past Medical History: Last updated: 12/14/2008 Hx of URINARY INCONTINENCE, MILD (ICD-788.30) SHOULDER PAIN (ICD-719.41) PERIPHERAL EDEMA (ICD-782.3) OSTEOARTHRITIS (ICD-715.90) HYPERTENSION (ICD-401.9) HYPERLIPIDEMIA (ICD-272. Anemia-NOS  Past Surgical History: Last updated: 12/20/2007 * ORIF RIGHT ELBOW. Hx of URINARY INCONTINENCE, MILD (ICD-788.30) SHOULDER PAIN (ICD-719.41) PERIPHERAL EDEMA (ICD-782.3) OSTEOARTHRITIS (ICD-715.90) HYPERTENSION (ICD-401.9) HYPERLIPIDEMIA (ICD-272.4)    Family History: Last updated: 09/03/2007 non-contributory  Social History: Last updated: 09/03/2007 widowed  2002, after 52 years of marriage 1 daughter, 1 son - both lives close enough to check on her regularly  Review of Systems       The patient complains of decreased hearing, peripheral edema, and difficulty walking.  The patient denies anorexia, fever, weight loss, weight gain, vision loss, hoarseness, chest pain, dyspnea on exertion, prolonged cough, headaches, abdominal pain, severe indigestion/heartburn, depression, and enlarged lymph nodes.    Physical Exam  General:  overweight white woman in a w/c in no distress Head:  normocephalic and atraumatic.   Eyes:  vision grossly intact, pupils equal, and pupils round, C&S clear.   Neck:  supple, no  thyromegaly, no JVD, and no carotid bruits.   Chest Wall:  no deformities.   Lungs:  normal respiratory effort, normal breath sounds, no crackles, and no wheezes.   Heart:  normal rate, regular rhythm, and no murmur.   Abdomen:  soft and normal bowel sounds.   Msk:  Knees are large but no effusion is appreciated with her sitting in w/c. Tender to movement, i.e. straightening Pulses:  2+ radial Extremities:  3+ edema LE Neurologic:  alert & oriented X3.   Skin:  chronic stasis changes with  mild erythema to skin distal LE along with multiple nodules of the skin. No open lesions or ulcerations. Psych:  Oriented X3, memory intact for recent and remote, normally interactive, and good eye contact.     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 If iron stores to normal will d/c iron supplement. Patient had normal colonoscopy June '11  Her updated medication list for this problem includes:    Ferrous Sulfate 325 (65 Fe) Mg Tabs (Ferrous sulfate) .Marland Kitchen... 1 tab two times a day  Orders: TLB-CBC Platelet - w/Differential (85025-CBCD) TLB-IBC Pnl (Iron/FE;Transferrin) (83550-IBC)  addendum:         april '11    6/16    6/17    6/19    10/11                               6/16           Hgb            10.2        9.9       7.7      8.3      9.8         retic cnt     1.3%  abs 31.2  Iron stores now normal. B12 low normal.  Plan - may d/c/ iron           may hold B12           repeat labs in 60 days, if Hgb doesn't continue to improve will need hematology consult.   Problem # 3:  VITAMIN B12 DEFICIENCY (ICD-266.2) Needs f/u lab  Orders: TLB-B12 + Folate Pnl (32951_88416-S06/TKZ)  Addendum - B12 in normal range.  Problem # 4:  OSTEOARTHRITIS (ICD-715.90) Patient with severe knee pain. she is advised that she may be a candidate for orthopedic intervention.  Her updated medication list for this problem includes:    Etodolac Cr 400 Mg Tb24 (Etodolac) .Marland Kitchen... Take 1 tablet by mouth two times a day  Problem # 5:  HYPERTENSION (ICD-401.9)  Her updated medication list for this problem includes:    Enalapril Maleate 20 Mg Tabs (Enalapril maleate) .Marland Kitchen... Take two times a day    Lasix 40 Mg Tabs (Furosemide) .Marland Kitchen... Take 1 tablet by mouth once a day    Lopressor 50 Mg Tabs (Metoprolol tartrate) .Marland Kitchen... 1 two times a day until toprol xl comes back     Metoprolol Succinate 100 Mg Xr24h-tab (Metoprolol succinate) .Marland Kitchen... Take 1 tablet by mouth once a day  Orders: TLB-BMP (Basic Metabolic Panel-BMET) (80048-METABOL)  BP today: 110/72 Prior BP: 122/62 (04/27/2010)  Good control on present medications.  Problem # 6:  HYPERLIPIDEMIA (ICD-272.4) Reviewed hospital labs from June - good control at that time.  Plan - continue present treatment which regulates bowels and lower cholesterol.  Her updated medication list for this problem includes:    Cholestyramine Light 4 Gm Pack (Cholestyramine light) .Marland Kitchen... 1 packet in water two times a day for cholesterol  Problem # 7:  CONGESTIVE HEART FAILURE (ICD-428.0) Patient had mild CHF during last hospitalization. She has been taking her medications and has had no respiratory problems and no symptoms to suggest decompensated heart failure.  Plan - continue present meds.  Her updated medication list for this problem includes:    Enalapril Maleate 20 Mg Tabs (Enalapril maleate) .Marland Kitchen... Take two times a day    Lasix 40 Mg Tabs (Furosemide) .Marland Kitchen... Take 1 tablet by mouth once a day    Lopressor 50 Mg Tabs (Metoprolol tartrate) .Marland Kitchen... 1 two times a day until toprol xl comes back    Metoprolol Succinate 100 Mg Xr24h-tab (Metoprolol succinate) .Marland Kitchen... Take 1 tablet by mouth once a day  Complete Medication List: 1)  Enalapril Maleate 20 Mg Tabs (Enalapril maleate) .... Take two times a day 2)  Lasix 40 Mg Tabs (Furosemide) .... Take 1 tablet by mouth once a day 3)  Lopressor 50 Mg Tabs (Metoprolol tartrate) .Marland Kitchen.. 1 two times a day until toprol xl comes back 4)  Etodolac Cr 400 Mg Tb24 (Etodolac) .... Take 1 tablet by mouth two times a day 5)  Ranitidine Hcl 150 Mg Tabs (Ranitidine hcl) .Marland Kitchen.. 1 two times a day 6)  Metoprolol Succinate 100 Mg Xr24h-tab (Metoprolol succinate) .... Take 1 tablet by mouth once a day 7)  Cholestyramine Light 4 Gm Pack (Cholestyramine light) .Marland Kitchen.. 1 packet in water two times a day for  cholesterol 8)  Omeprazole 40 Mg Cpdr (Omeprazole) .Marland Kitchen.. 1 qm 9)  Ferrous Sulfate 325 (65 Fe) Mg Tabs (Ferrous sulfate) .Marland Kitchen.. 1 tab two times a day

## 2010-12-31 LAB — CBC
HCT: 30.4 % — ABNORMAL LOW (ref 36.0–46.0)
Hemoglobin: 9.9 g/dL — ABNORMAL LOW (ref 12.0–15.0)
MCHC: 32.8 g/dL (ref 30.0–36.0)
MCV: 100.2 fL — ABNORMAL HIGH (ref 78.0–100.0)
MCV: 101.2 fL — ABNORMAL HIGH (ref 78.0–100.0)
Platelets: 241 10*3/uL (ref 150–400)
Platelets: 307 10*3/uL (ref 150–400)
RBC: 2.34 MIL/uL — ABNORMAL LOW (ref 3.87–5.11)
RBC: 3.01 MIL/uL — ABNORMAL LOW (ref 3.87–5.11)
RDW: 12.2 % (ref 11.5–15.5)
RDW: 13.1 % (ref 11.5–15.5)
WBC: 8.4 10*3/uL (ref 4.0–10.5)

## 2010-12-31 LAB — URINALYSIS, ROUTINE W REFLEX MICROSCOPIC
Ketones, ur: NEGATIVE mg/dL
Nitrite: POSITIVE — AB
Protein, ur: NEGATIVE mg/dL
Urobilinogen, UA: 0.2 mg/dL (ref 0.0–1.0)
pH: 5 (ref 5.0–8.0)

## 2010-12-31 LAB — BASIC METABOLIC PANEL
CO2: 24 mEq/L (ref 19–32)
CO2: 27 mEq/L (ref 19–32)
CO2: 27 mEq/L (ref 19–32)
Chloride: 104 mEq/L (ref 96–112)
Chloride: 104 mEq/L (ref 96–112)
Chloride: 108 mEq/L (ref 96–112)
Creatinine, Ser: 1.16 mg/dL (ref 0.4–1.2)
Creatinine, Ser: 1.23 mg/dL — ABNORMAL HIGH (ref 0.4–1.2)
GFR calc Af Amer: 50 mL/min — ABNORMAL LOW (ref >60)
GFR calc Af Amer: 54 mL/min — ABNORMAL LOW (ref >60)
Glucose, Bld: 102 mg/dL — ABNORMAL HIGH (ref 70–99)
Potassium: 3.4 mEq/L — ABNORMAL LOW (ref 3.5–5.1)
Potassium: 3.6 mEq/L (ref 3.5–5.1)
Sodium: 139 mEq/L (ref 135–145)
Sodium: 140 mEq/L (ref 135–145)

## 2010-12-31 LAB — DIFFERENTIAL
Basophils Absolute: 0 10*3/uL (ref 0.0–0.1)
Basophils Relative: 0 % (ref 0–1)
Eosinophils Absolute: 0 10*3/uL (ref 0.0–0.7)
Eosinophils Relative: 0 % (ref 0–5)
Monocytes Absolute: 0.6 10*3/uL (ref 0.1–1.0)
Monocytes Relative: 7 % (ref 3–12)
Neutro Abs: 7.3 10*3/uL (ref 1.7–7.7)
Neutrophils Relative %: 87 % — ABNORMAL HIGH (ref 43–77)

## 2010-12-31 LAB — LIPID PANEL
Cholesterol: 187 mg/dL (ref 0–200)
LDL Cholesterol: 105 mg/dL — ABNORMAL HIGH (ref 0–99)

## 2010-12-31 LAB — IRON AND TIBC: UIBC: 145 ug/dL

## 2010-12-31 LAB — COMPREHENSIVE METABOLIC PANEL
ALT: 200 U/L — ABNORMAL HIGH (ref 0–35)
AST: 119 U/L — ABNORMAL HIGH (ref 0–37)
Alkaline Phosphatase: 110 U/L (ref 39–117)
CO2: 25 mEq/L (ref 19–32)
CO2: 26 mEq/L (ref 19–32)
Calcium: 8.2 mg/dL — ABNORMAL LOW (ref 8.4–10.5)
Chloride: 105 mEq/L (ref 96–112)
Creatinine, Ser: 1.11 mg/dL (ref 0.4–1.2)
GFR calc Af Amer: 57 mL/min — ABNORMAL LOW (ref >60)
GFR calc Af Amer: 57 mL/min — ABNORMAL LOW (ref >60)
GFR calc non Af Amer: 47 mL/min — ABNORMAL LOW (ref >60)
Potassium: 3.6 mEq/L (ref 3.5–5.1)
Potassium: 4 mEq/L (ref 3.5–5.1)
Sodium: 137 mEq/L (ref 135–145)
Total Bilirubin: 0.6 mg/dL (ref 0.3–1.2)

## 2010-12-31 LAB — CARDIAC PANEL(CRET KIN+CKTOT+MB+TROPI)
CK, MB: 4.8 ng/mL — ABNORMAL HIGH (ref 0.3–4.0)
Total CK: 960 U/L — ABNORMAL HIGH (ref 7–177)
Troponin I: 0.04 ng/mL (ref 0.00–0.06)

## 2010-12-31 LAB — RETICULOCYTES
RBC.: 2.4 MIL/uL — ABNORMAL LOW (ref 3.87–5.11)
Retic Ct Pct: 1.3 % (ref 0.4–3.1)

## 2010-12-31 LAB — BRAIN NATRIURETIC PEPTIDE: Pro B Natriuretic peptide (BNP): 307 pg/mL — ABNORMAL HIGH (ref 0.0–100.0)

## 2010-12-31 LAB — URINE MICROSCOPIC-ADD ON

## 2010-12-31 LAB — HEMOGLOBIN AND HEMATOCRIT, BLOOD
HCT: 25.2 % — ABNORMAL LOW (ref 36.0–46.0)
Hemoglobin: 8.3 g/dL — ABNORMAL LOW (ref 12.0–15.0)

## 2011-01-25 ENCOUNTER — Telehealth: Payer: Self-pay | Admitting: *Deleted

## 2011-01-25 MED ORDER — METOPROLOL SUCCINATE ER 100 MG PO TB24: 100.0000 mg | ORAL_TABLET | Freq: Every day | ORAL | Status: DC

## 2011-01-25 NOTE — Telephone Encounter (Signed)
Refill sent in

## 2011-02-20 ENCOUNTER — Other Ambulatory Visit: Payer: Self-pay | Admitting: Internal Medicine

## 2011-02-22 ENCOUNTER — Other Ambulatory Visit: Payer: Self-pay | Admitting: Internal Medicine

## 2011-02-23 ENCOUNTER — Other Ambulatory Visit: Payer: Self-pay | Admitting: Internal Medicine

## 2011-02-23 NOTE — Telephone Encounter (Signed)
Already filled Rx yesterday.

## 2011-02-27 NOTE — Discharge Summary (Signed)
NAMEBRITT, [name]                 ACCOUNT NO.:  [id number]   MEDICAL RECORD NO.:  [id number]          PATIENT TYPE:  INP   LOCATION:  5016                         FACILITY:  [facility]   PHYSICIAN:  Valerie A. Felicity Coyer, MDDATE OF BIRTH:  1925/08/22   DATE OF PROCEDURE:  DATE OF DISCHARGE:  06/20/2007                    STAT - MUST CHANGE TO CORRECT WORK TYPE   DISCHARGE DIAGNOSES:  1. Rhabdomyolysis.  2. Status post fall.  3. Escherichia coli urinary tract infection status post five days of      total treatment with antibiotics.  4. Right shoulder tendinitis with degenerative joint disease and      trapezius strain.  5. Mild hypokalemia.  6. Hypertension.  7. Dyslipidemia.   HISTORY OF PRESENT ILLNESS:  This patient is an 77 year old female who  was admitted on June 15, 2007, with a chief complaint of a fall.  She  has a history of mobility problems secondary to obesity and severe  degenerative joint disease of her knees.  On the evening prior to  admission she had some difficulty with ambulation and she had a fall in  the bathroom after losing her footing.  She was unable to get up and she  laid on the floor for seven to nine hours.  She was found by family on  the morning of admission.  She was noted to have decreased range of  motion of her right upper extremity and shoulder upon admission.  The  family thought that she had some difficulty with swallowing with the  question of a facial droop.  She was admitted for probable  rhabdomyolysis for IV hydration and physical therapy evaluation.  She  was also noted to have leukocytosis and a low-grade fever upon  admission.  She was admitted for further evaluation and treatment.   PAST MEDICAL HISTORY:  1. Osteoarthritis of the bilateral knees.  2. Hyperlipidemia.  3. Hypertension.  4. Morbid obesity.  5. Venostasis with skin changes.  6. Open reduction and internal fixation of the right elbow.   COURSE OF HOSPITALIZATION:  1.  Rhabdomyolysis status post fall:  The patient was noted to have an      elevated CK level and she was given IV hydration.  The CK levels      continued to trend downward appropriately.  Her renal function      remained stable throughout this admission.  2. Escherichia coli urinary tract infection:  This is likely the cause      for the patient's low grade fever and she was treated with a five      day course of antibiotics.  3. Dyslipidemia:  The patient was on Vytorin prior to admission.  The      patient's daughter was concerned that this may contributed to the      patient's rhabdomyolysis, although the fall was likely the primary      cause.  We will hold this medication at her request and defer this      to the patient's primary care physician.  4. Hypertension:  The patient's blood pressure remained stable during  this admission and we will plan to continue her home medications as      before.  5. Decreased range of motion of the right shoulder:  The patient did      undergo an MRI of the right shoulder during this admission.  There      is noted moderate rotator cuff tendinopathy/tendinosis.  Tendinosis      of the subscapularis tendon was also noted.  6. There were also findings which were concerning for humeral evulsion      of the posture band of the inferior bony humeral ligament and a      probable trapezius tear or strain.  7. Subacromial/subdeltoid bursitis was also noted.   PLAN:  At this time we will plan to continue with physical therapy for  improved strengthening and range of motion of the right shoulder.   MEDICATIONS:  Her medications at the time of discharge included:  1. Lasix 40 mg by mouth every day.  2. Vasotec 20 mg by mouth every day.  3. Toprol-XL 100 mg by mouth every day.  4. Etodolac 400 mg by mouth twice per day.  5. Aspirin 81 mg by mouth every day.  6. Lovenox 40 mg subcutaneously every day for deep venous thrombosis      prophylaxis.    LABORATORY DATA:  Her laboratory studies at the time of discharge  revealed:  BUN 10.  Creatinine 0.71.  CK 1106.  Urine culture with  Escherichia coli - pansensitive.   DISPOSITION:  Plan to transfer the patient to the [facility] for additional rehabilitation prior to her return to home.  Upon discharge and as needed she will need follow-up with her primary  care physician, [name], M.D.   PHYSICAL EXAMINATION:  GENERAL:  The patient is a morbidly obese,  elderly white female who is awake, alert, and in no acute distress.  She  is hard of hearing.  VITAL SIGNS:  Blood pressure:  141/62.  Pulse:  67.  Respirations:  18.  Temperature:  98.3 degrees.  Oxygen saturations:  96% on room air.  CARDIOVASCULAR:  S1 and S2.  Regular rate and rhythm.  LUNGS:  The lungs are clear to auscultation bilaterally with no wheezes,  rales, or rhonchi.  ABDOMEN:  The abdomen is soft, obese, non-tender, and non-distended with  positive bowel sounds noted.  EXTREMITIES:  The patient is noted to have bilateral lower extremity 3  to 4+ edema with chronic venostasis changes to the bilateral lower  extremities.  NEUROLOGIC:  The patient is awake, alert, and oriented x3.  She is  moving all extremities.  She is noted to have strong equal bilaterally  hand grasps.  She is noted to have positive facial symmetry.  Speech is  clear.  She is somewhat hard of hearing.  She is able to raise both legs  off the bed without difficulty.  Speech is clear.     Sandford Craze, NP      Raenette Rover. Felicity Coyer, MD  Electronically Signed   MO/MEDQ  D:  06/20/2007  T:  06/20/2007  Job:  [id number]   cc:   Olivia Gess. Norins, MD

## 2011-02-27 NOTE — H&P (Signed)
NAMESUNDEEP, CARY                 ACCOUNT NO.:  [id number]   MEDICAL RECORD NO.:  [id number]          PATIENT TYPE:  EMS   LOCATION:  MAJO                         FACILITY:  [facility]   PHYSICIAN:  Rosalyn Gess. Norins, MD  DATE OF BIRTH:  [birth year]   DATE OF ADMISSION:  06/15/2007  DATE OF DISCHARGE:                              HISTORY & PHYSICAL   CHIEF COMPLAINT:  I fell and I could not get up.   HISTORY OF PRESENT ILLNESS:  Ms. Twiggs is an 77 year old widowed white  female with mobility problems secondary to obesity and severe  degenerative joint disease of her knees.  On the night prior to  admission, she had some difficulty with ambulation.  She was in the  bathroom and lost her footing and fell.  She was unable to get up and  laid on the floor for 7-9 hours, being found by family this morning.  This morning, she did have decreased range of motion of her right upper  extremity and shoulder.  By family reports, she had some difficulty with  swallowing and a question of a facial droop.  In the emergency  department, she does seem neurologically intact.  She is now admitted  with probable rhabdomyolysis for IV fluids.  She will also have PT/OT  evaluation.  In addition, the patient does have a leukocytosis and low-  grade fever and will be treated for infection.   PAST SURGICAL HISTORY:  Operative repair and internal fixation of her  right elbow.   PAST MEDICAL HISTORY:  1. Usual childhood disease.  2. Osteoarthritis, knees.  3. Hyperlipidemia.  4. Hypertension.  5. Morbid obesity.  6. Venostasis with skin changes.   GYN HISTORY:  Gravida 2, para 2.   FAMILY HISTORY:  Positive for hypertension, TIA, cancer.  Sister died of  cancer.  Sister died of unknown causes.  Brother with normal pressure  hydrocephalus.   SOCIAL HISTORY:  The patient was married for more than 50 years.  She  has been widowed since 2002.  She resides in her own home and lives  independently, although  her children live nearby.   CURRENT MEDICATIONS:  1. Lasix 40 mg daily.  2. Vytorin 10/20 daily.  3. Enalapril 20 mg daily.  4. Metoprolol 100 mg daily.  5. Etodolac 400 mg b.i.d.  6. Advil.   REVIEW OF SYSTEMS:  Negative except for the HPI, with the patient  denying any chest pain, GI symptoms, respiratory symptoms.   PHYSICAL EXAMINATION:  VITAL SIGNS:  At admission, temperature of 99.3,  blood pressure 146/71, heart rate 94, respirations were 20.  GENERAL:  This is an obese white female in no acute distress.  HEENT:  Normocephalic, atraumatic with no signs of trauma.  No Battle  signs, no racoon's eyes.  Conjunctivae and sclerae clear.  NECK:  Supple.  No thyromegaly appreciated.  LYMPHATICS:  There is no adenopathy in the submandibular or cervical  regions.  CHEST:  Without deformity.  No CVA tenderness.  LUNGS:  Clear with no rales, wheezes or rhonchi.  CARDIOVASCULAR:  2+ radial  pulse.  She had a regular rate and rhythm  without murmurs.  No JVD is noted.  No carotid bruits are noted.  BREASTS:  Exam deferred.  ABDOMEN:  Obese with positive bowel sounds with no organosplenomegaly,  although exam is hindered by her obesity.  GENITALIA:  The patient has normal external genitalia.  RECTAL:  Exam deferred.  EXTREMITIES:  The patient has some swelling about her knees which is  chronic.  She has a scar at her right elbow which is chronic.  DERMATOLOGIC:  The patient has a red linear erosive rash in the  intertriginous area beneath the panniculus at the right groin.  The  patient has chronic stasis changes of her distal lower extremities with  erythema and nodular skin.  NEUROLOGIC:  The patient is alert and oriented x4.  Cranial nerves II-  XII were grossly intact, with normal facial symmetry and muscle  movement.  Extraocular muscles were intact.  Pupils equal, round and  reactive to light and accommodation.  She had no deviation of the tongue  or uvula.  Motor strength was  4/5 throughout, except the patient has no  movement of her right shoulder.  She is able to raise her legs off the  bed against gravity but does not offer resistance.  Cerebellar function  was unremarkable with no tremor noted.  No cogwheeling was noted,  although the patient was not stood or ambulated.   LABORATORY:  Hemoglobin 12.8 grams, white count 16,900, platelet count  346,000.  Chemistries with sodium 135, potassium 3.7, chloride 99, CO2  24, BUN 23, creatinine 1.0, glucose 149.  UA with a specific gravity of  1.09, 2+ leukocyte esterase, many bacteria, 0-2 WBCs.   ASSESSMENT/PLAN:  1. Fall.  The patient is at risk for rhabdomyolysis.  CK enzymes are      pending.  Plan for 24-hour observation and admission to a regular      non-telemetry bed,  intravenous fluids at 125 mL an hour x1 liter      and then 100 mL an hour.  Serial CKs will be obtained.  2. Neurologic.  Patient with increased instability, decreased range of      motion of her right shoulder.  No clear evidence of a      cerebrovascular accident.  CT scan is negative.  Plan serial      neurologic exams and physical therapy evaluation to be ordered.  3. Dermatologic.  Patient with intertriginous rash which looks      __________  in nature.  Will use nystatin powder 100,000 units      applied to the area twice a day.  4. Infectious disease.  Patient with low-grade fever and leukocytosis.      Plan:  Will start the patient on Rocephin, giving her 1 gram now      and 1 gram q.24h., with the intention if she remains stable with no      other abnormalities showing up to switch her to Ceftin 250 b.i.d.      at time of discharge.   In summary, this is a pleasant patient who is admitted with a possible  rhabdomyolysis, possible urinary tract infection, with weakness in her  right shoulder.      Rosalyn Gess Norins, MD  Electronically Signed     MEN/MEDQ  D:  06/15/2007  T:  06/16/2007  Job:  [id number]

## 2011-03-02 NOTE — Assessment & Plan Note (Signed)
[facility]                             PRIMARY CARE OFFICE NOTE   NAME:Lovins, ADILENNE ASHWORTH                        MRN:          [id number]  DATE:08/26/2006                            DOB:          1925-07-22    Ms. Uncapher is a pleasant, 77 year old woman who presents for followup  evaluation and exam.  She was last seen in the office on 04/23/2006 for  followup of her blood pressure.  At that time she was better controlled at  home than in the office and her medications were renewed.  The patient had  been seen on 05/08/2005 for stasis dermatitis which resolved.  The patient  had been seen on 02/22/2005 for hypertension, lipids, as well as knee pain  although she is not a surgical candidate by her wishes.   INTERVAL HISTORY:  1. GU:  The patient reports a lifelong history of mild incontinence which      is becoming worse.  She denies any frequency, urgency, burning or other      discomfort.  2. Hypertension.  The patient reports her blood pressure at home has been      running in the 130s-140s/80s. In the office it is running higher.  3. Musculoskeletal.  The patient continues to have significant      restrictions in ambulation secondary to advanced degenerative joint      disease in both knees.  4. The patient is having erythema and swelling bilaterally in the lower      extremities consistent with their history of stasis dermatitis.  The      patient also with a concern for a new tan lesion on her left breast      just at the areola inferiorly.  5. Lipids.  The patient has not had followup laboratories and is due at      this time.   PAST SURGICAL HISTORY:  ORIF right elbow.   PAST MEDICAL HISTORY:  1. Usual childhood disease.  2. Severe osteoarthritis of her knees.  3. Hyperlipidemia.  4. Hypertension.  5. Morbid obesity.   GYN:  Gravida 2, para 2.   FAMILY HISTORY:  Positive for hypertension, TIA, brother died of unspecified  cancer,  sister died of unknown causes at age 33, sister died of cancer,  unspecified type at 16.  Brother with normal pressure hydrocephalus.   SOCIAL HISTORY:  The patient was married for more than 50 years.  She was  widowed in 2002.  She continues to reside in her own home.  She has a  supportive family with her children living nearby.   REVIEW OF SYSTEMS:  Negative for any constitutional, cardiovascular,  respiratory, GI, or GU problems other than noted.   CURRENT MEDICATIONS:  1. Furosemide 40 mg daily.  2. Vytorin 10/20 once daily.  3. Enalapril 20 mg daily.  4. Metoprolol 100 mg daily.  5. Etodolac 400 mg b.i.d.  6. Advil p.r.n.   PHYSICAL EXAMINATION:  With the assistance of Kerry Kass, FNP student.  Temperature was 97.3, blood pressure 177/81, pulse 80, weight 240.  GENERAL APPEARANCE:  This is an obese, Caucasian female looks her stated age  in no acute distress.  HEENT EXAM:  Normocephalic, atraumatic.  EAC.  TMs were normal.  Oropharynx  with native dentition with normal mucous membranes.  Posterior pharynx was  clear.  Conjunctivae and sclerae were clear.  Pupils equal, round, and  reacted to light and to accommodation.  Funduscopic exam deferred.  NECK:  Supple without thyromegaly.  NOSE:  No adenopathy was noted in the cervical, supraclavicular, or axillary  regions.  CHEST:  No CVA tenderness.  LUNGS:  Clear with no rales, wheezes or rhonchi.  BREASTS EXAM:  Skin was normal.  The patient has a tan lesion with an  irregular border approximately 0.6 cm in diameter, uniform in color.  Nipples were without discharge.  There is no fixed mass lesion.  The patient  does have fibrous changes in the inferior aspects of both breasts.  CARDIOVASCULAR:  A 2+ radial pulse.  No JVD or carotid bruits.  She had a  quiet epicardium with a regular rate and rhythm without murmurs rubs or  gallops.  ABDOMEN:  Soft with no guarding or rebound.  She is obese.  Palpation of  internal  organs was hindered both by her size and the fact that she had to  be examined in a sitting position.  PELVIC AND RECTAL EXAMS:  Deferred.  EXTREMITIES:  Without clubbing, cyanosis, no significant deformities were  noted.  The patient does have significant edema of both lower extremities.  DERM:  The patient has mild erythema of her distal lower extremities.  She  has multiple age-related changes and several benign appearing lesions.  NEUROLOGIC EXAM:  Nonfocal.  Laboratory ordered and pending.  Includes a lipid panel, AST, ALT, and basic  metabolic panel.   ASSESSMENT AND PLAN:  1. Hypertension.  The patient's blood pressure is poorly controlled.  She      does report that lately her blood pressure has been running higher at      home.  Plan:  The patient to increase her enalapril to 40 mg daily.      She will continue her other medications.  She will monitor her blood      pressure at home and report back.  2. MSK.  The patient with significant osteoarthritis affecting both knees.      She has been offered and has declined surgery in the past.  She is      limited in her ability to stand and ambulate.  She does manage her      activities of daily living and remains independent.  Plan:  The patient to continue  Etodolac.  Weight loss would be beneficial.  1. Lipids.  The patient for laboratory today.  We will adjust her      medications, as indicated, to keep her LVL at a goal of less than 130,      and closer to 100 if possible.  2. GU. The patient with frequent wetting with no pain or discomfort.  She      reports that this is a life long problem.  I believe that she may have      a problem with mild irritable bladder, possibly compounded by stress      incontinence.  Plan:  Trial of Vesicare 5mg .  If she gets good results      we will continue her on oxybutynin.  3. Health Maintenance:  The patient is to schedule  a mammogram.  Last     mammogram dates from 06/27/2004.  The  patient's daughter is with her      and will ensure that this gets done. The patient would be a candidate      for colorectal cancer screening and should consider colonoscopy.   This is a delightful woman with problems as outlined above.  She does seem  medically stable.  Laboratories ordered and pending, and she will be  notified by phone for the results.     Rosalyn Gess Norins, MD  Electronically Signed    MEN/MedQ  DD: 08/27/2006  DT: 08/27/2006  Job #: [id number]   cc:   Bethena Roys

## 2011-03-06 ENCOUNTER — Other Ambulatory Visit (INDEPENDENT_AMBULATORY_CARE_PROVIDER_SITE_OTHER): Payer: Medicare Other

## 2011-03-06 ENCOUNTER — Ambulatory Visit (INDEPENDENT_AMBULATORY_CARE_PROVIDER_SITE_OTHER): Payer: Medicare Other | Admitting: Internal Medicine

## 2011-03-06 DIAGNOSIS — E538 Deficiency of other specified B group vitamins: Secondary | ICD-10-CM

## 2011-03-06 DIAGNOSIS — I10 Essential (primary) hypertension: Secondary | ICD-10-CM

## 2011-03-06 DIAGNOSIS — I509 Heart failure, unspecified: Secondary | ICD-10-CM

## 2011-03-06 DIAGNOSIS — D649 Anemia, unspecified: Secondary | ICD-10-CM

## 2011-03-06 LAB — COMPREHENSIVE METABOLIC PANEL
AST: 17 U/L (ref 0–37)
Albumin: 3.6 g/dL (ref 3.5–5.2)
BUN: 46 mg/dL — ABNORMAL HIGH (ref 6–23)
Calcium: 9.6 mg/dL (ref 8.4–10.5)
Chloride: 104 mEq/L (ref 96–112)
Creatinine, Ser: 1.7 mg/dL — ABNORMAL HIGH (ref 0.4–1.2)
Glucose, Bld: 104 mg/dL — ABNORMAL HIGH (ref 70–99)

## 2011-03-06 LAB — CBC WITH DIFFERENTIAL/PLATELET
Basophils Relative: 1.1 % (ref 0.0–3.0)
Eosinophils Absolute: 0.2 10*3/uL (ref 0.0–0.7)
Eosinophils Relative: 3.1 % (ref 0.0–5.0)
Hemoglobin: 9.5 g/dL — ABNORMAL LOW (ref 12.0–15.0)
Lymphocytes Relative: 26.7 % (ref 12.0–46.0)
MCHC: 34.2 g/dL (ref 30.0–36.0)
MCV: 98.9 fl (ref 78.0–100.0)
Monocytes Absolute: 0.6 10*3/uL (ref 0.1–1.0)
Neutro Abs: 4.6 10*3/uL (ref 1.4–7.7)
RBC: 2.79 Mil/uL — ABNORMAL LOW (ref 3.87–5.11)
WBC: 7.4 10*3/uL (ref 4.5–10.5)

## 2011-03-06 LAB — VITAMIN B12: Vitamin B-12: 310 pg/mL (ref 211–911)

## 2011-03-06 LAB — BRAIN NATRIURETIC PEPTIDE: Pro B Natriuretic peptide (BNP): 226 pg/mL — ABNORMAL HIGH (ref 0.0–100.0)

## 2011-03-07 NOTE — Progress Notes (Signed)
Subjective:    Patient ID: [name], female    DOB: 12/07/1924, 77 y.o.   MRN: [id number]  HPI [name] returns for medical follow-up. In the interval since her last visit she has not had any hospitalizations, emergency treatment or real change in her condition. She continues to have advanced peripheral edema with significant changes in her skin - red, nodular appearance with occasional serosanguinous drainage. She does persist in sitting with her legs dependent for long periods of time. She is very limited in her mobility. She was unable to tolerate support hose: couldn't get them on without extreme effort and assistance and when in place they were painful.   Her OA - knees is unchanged and is limiting. Home Health PT has been working with her but she has not made much progress.   Blood pressure was noted by the physcial therapist to be low and they advised her to reduce enalapril to 20mg  once a day (???).  On our advice she has not been taking B12 nasal spray after completing a several month's course and is due for B12 check.    Past Medical History: Hx of URINARY INCONTINENCE, MILD (ICD-788.30) SHOULDER PAIN (ICD-719.41) PERIPHERAL EDEMA (ICD-782.3) OSTEOARTHRITIS (ICD-715.90) HYPERTENSION (ICD-401.9) HYPERLIPIDEMIA (ICD-272. Anemia-NOS  Past Surgical History: * ORIF RIGHT ELBOW.     Family History: non-contributory  Social History: widowed  2002, after 52 years of marriage, lives alone 1 daughter, 1 son - both lives close enough to check on her regularly        Review of Systems  Constitutional: Negative for fever, chills, activity change, appetite change and unexpected weight change.  HENT: Negative.   Eyes: Negative.   Respiratory: Positive for shortness of breath. Negative for cough, chest tightness and stridor.   Cardiovascular: Negative for chest pain and palpitations.  Gastrointestinal: Negative.   Genitourinary: Negative for urgency, frequency, flank  pain, difficulty urinating and pelvic pain.  Musculoskeletal: Positive for back pain, arthralgias and gait problem.  Neurological: Positive for weakness. Negative for dizziness, tremors, facial asymmetry, speech difficulty and headaches.  Hematological: Negative for adenopathy.  Psychiatric/Behavioral: Negative.        Objective:   Physical Exam Vitals reviewed Gen'l - obese WW sitting in a w./c in no distress HEENT - C&S clear Lungs - CTAP, no increased WOB Cor - 2+ radial pulse , RRR Abdomen - obese, BS+ Extremities - no deformity of major joints; knees difficult to assess due to obesity and immobiity Derm - 3+ edema to knees; chronic skin changes with erythema, nodular skin changes, no calore, no open sores or ulcerations.       Assessment & Plan:  1. Peripheral edema - her major problem at this time. Fortunately there is no ulceration  Plan - continue present medications.           Elevated legs several times a day.  2. B12 deficiency for lab today with recommendations to follow  3. CHF - no evidence of decompensation at today's exam.  Plan - continue present medications.            BNP  4. OA knees - limited options with her living at home alone and being immobile  Plan - continue symptom management           She should continue to exercise as instructed by PT but question the value of any further PT at home.  5. Hypertension - adequate control on present medications  6. Anemia - will check  follow-up CBC. Previous iron studies were normal  7. Hyperlipidemia - due for follow up lab  Lab Results  Component Value Date   WBC 7.4 03/06/2011   HGB 9.5* 03/06/2011   HCT 27.6* 03/06/2011   PLT 294.0 03/06/2011   CHOL  Value: 187        ATP III CLASSIFICATION:  <200     mg/dL   Desirable  161-096  mg/dL   Borderline High  >=045    mg/dL   High        01/21/8118   TRIG 171* 04/02/2010   HDL 48 04/02/2010   LDLDIRECT 172.7 08/05/2008   ALT 9 03/06/2011   AST 17 03/06/2011    NA 141 03/06/2011   K 4.9 03/06/2011   CL 104 03/06/2011   CREATININE 1.7* 03/06/2011   BUN 46* 03/06/2011   CO2 26 03/06/2011   TSH 0.39 01/23/2010   Lab Results  Component Value Date   VITAMINB12 310 03/06/2011    Addendum:  B12 deficiency- just below normal range. - Plan resume B12 replacement - injection therapy  Lipids - very high LDL - will recommend lipitor 40mg  once a day  Renal insufficiency:                                        June '11         Oct '11         May '12                        Creat.       1.16              1.5                1.7      Will encourage hydration. Will need follow-up lab in 3 months.  Patient will need follow-up appointment to review the above.

## 2011-07-27 LAB — URINE MICROSCOPIC-ADD ON

## 2011-07-27 LAB — COMPREHENSIVE METABOLIC PANEL
AST: 90 — ABNORMAL HIGH
Albumin: 4
Alkaline Phosphatase: 43
BUN: 23
CO2: 24
Chloride: 99
Creatinine, Ser: 0.98
GFR calc Af Amer: >60
GFR calc non Af Amer: 55 — ABNORMAL LOW
Potassium: 3.7
Total Bilirubin: 0.8

## 2011-07-27 LAB — URINALYSIS, ROUTINE W REFLEX MICROSCOPIC
Glucose, UA: NEGATIVE
Ketones, ur: 15 — AB
Nitrite: POSITIVE — AB
Specific Gravity, Urine: 1.019
pH: 6

## 2011-07-27 LAB — CK
Total CK: 1106 — ABNORMAL HIGH
Total CK: 2443 — ABNORMAL HIGH
Total CK: 4334 — ABNORMAL HIGH

## 2011-07-27 LAB — I-STAT 8, (EC8 V) (CONVERTED LAB)
Acid-Base Excess: 2
BUN: 22
Chloride: 103
Potassium: 3.7
pCO2, Ven: 41.2 — ABNORMAL LOW
pH, Ven: 7.415 — ABNORMAL HIGH

## 2011-07-27 LAB — CBC
HCT: 28.1 — ABNORMAL LOW
HCT: 37.2
MCV: 96.5
Platelets: 317
RBC: 3.85 — ABNORMAL LOW
RDW: 12.8
WBC: 11 — ABNORMAL HIGH
WBC: 16.9 — ABNORMAL HIGH

## 2011-07-27 LAB — URINE CULTURE: Colony Count: >=100000

## 2011-07-27 LAB — DIFFERENTIAL
Basophils Absolute: 0
Basophils Relative: 0
Eosinophils Absolute: 0
Eosinophils Relative: 0
Lymphocytes Relative: 6 — ABNORMAL LOW
Monocytes Absolute: 1.3 — ABNORMAL HIGH

## 2011-07-27 LAB — PROTIME-INR: Prothrombin Time: 13.2

## 2011-07-27 LAB — BASIC METABOLIC PANEL
CO2: 29
Chloride: 106
GFR calc Af Amer: >60
Sodium: 141

## 2011-07-27 LAB — POCT I-STAT CREATININE
Creatinine, Ser: 0.8
Operator id: 151321

## 2011-08-28 ENCOUNTER — Other Ambulatory Visit: Payer: Self-pay | Admitting: Internal Medicine

## 2011-09-27 ENCOUNTER — Other Ambulatory Visit: Payer: Self-pay | Admitting: Internal Medicine

## 2011-12-18 ENCOUNTER — Emergency Department (HOSPITAL_COMMUNITY)
Admission: EM | Admit: 2011-12-18 | Discharge: 2011-12-18 | Disposition: A | Discharge status: Home / Self Care | Payer: Medicare Other | Attending: Emergency Medicine | Admitting: Emergency Medicine

## 2011-12-18 ENCOUNTER — Other Ambulatory Visit: Payer: Self-pay

## 2011-12-18 ENCOUNTER — Telehealth: Payer: Self-pay

## 2011-12-18 ENCOUNTER — Encounter (HOSPITAL_COMMUNITY): Payer: Self-pay

## 2011-12-18 DIAGNOSIS — M25569 Pain in unspecified knee: Secondary | ICD-10-CM | POA: Insufficient documentation

## 2011-12-18 DIAGNOSIS — R12 Heartburn: Secondary | ICD-10-CM | POA: Insufficient documentation

## 2011-12-18 DIAGNOSIS — R5381 Other malaise: Secondary | ICD-10-CM | POA: Insufficient documentation

## 2011-12-18 DIAGNOSIS — R609 Edema, unspecified: Secondary | ICD-10-CM | POA: Insufficient documentation

## 2011-12-18 DIAGNOSIS — M1990 Unspecified osteoarthritis, unspecified site: Secondary | ICD-10-CM

## 2011-12-18 HISTORY — DX: Unspecified osteoarthritis, unspecified site: M19.90

## 2011-12-18 LAB — URINALYSIS, ROUTINE W REFLEX MICROSCOPIC
Nitrite: NEGATIVE
Specific Gravity, Urine: 1.013 (ref 1.005–1.030)
Urobilinogen, UA: 0.2 mg/dL (ref 0.0–1.0)

## 2011-12-18 LAB — POCT I-STAT TROPONIN I: Troponin i, poc: 0.03 ng/mL (ref 0.00–0.08)

## 2011-12-18 LAB — BASIC METABOLIC PANEL
Chloride: 96 mEq/L (ref 96–112)
GFR calc Af Amer: 42 mL/min — ABNORMAL LOW (ref >90)
GFR calc non Af Amer: 36 mL/min — ABNORMAL LOW (ref >90)
Potassium: 3.9 mEq/L (ref 3.5–5.1)
Sodium: 134 mEq/L — ABNORMAL LOW (ref 135–145)

## 2011-12-18 LAB — CBC
MCH: 32.1 pg (ref 26.0–34.0)
MCHC: 33.2 g/dL (ref 30.0–36.0)
Platelets: 381 10*3/uL (ref 150–400)
RBC: 3.05 MIL/uL — ABNORMAL LOW (ref 3.87–5.11)

## 2011-12-18 LAB — DIFFERENTIAL
Basophils Relative: 0 % (ref 0–1)
Eosinophils Absolute: 0.3 10*3/uL (ref 0.0–0.7)
Lymphs Abs: 2 10*3/uL (ref 0.7–4.0)
Neutro Abs: 5.3 10*3/uL (ref 1.7–7.7)
Neutrophils Relative %: 64 % (ref 43–77)

## 2011-12-18 MED ORDER — OXYCODONE-ACETAMINOPHEN 5-325 MG PO TABS: 2.0000 | ORAL_TABLET | ORAL | Status: DC | PRN

## 2011-12-18 MED ORDER — OXYCODONE-ACETAMINOPHEN 5-325 MG PO TABS
1.0000 | ORAL_TABLET | Freq: Once | ORAL | Status: AC
  Administered 2011-12-18: 1 via ORAL
  Filled 2011-12-18: qty 1

## 2011-12-18 MED ORDER — GI COCKTAIL ~~LOC~~
30.0000 mL | Freq: Once | ORAL | Status: AC
  Administered 2011-12-18: 30 mL via ORAL
  Filled 2011-12-18: qty 30

## 2011-12-18 NOTE — ED Provider Notes (Signed)
History     CSN: [id number]  Arrival date & time 12/18/11  1409   First MD Initiated Contact with Patient 12/18/11 1746      Chief Complaint  Patient presents with  . Knee Pain    bilateral.  has arthritis, but pain is worse and pt unable to get around.    (Consider location/radiation/quality/duration/timing/severity/associated sxs/prior treatment) Patient is a 78 y.o. female presenting with knee pain. The history is provided by the patient and a relative.  Knee Pain Pertinent negatives include no chest pain, no headaches and no shortness of breath.   the patient is an 78 year old, female, with known degenerative joint disease of her knees, who presents to the emergency department complaining of increasing pain.  For the past several days.  Today.  The pain was so severe she cannot stand up.  She has not fallen.  She denies fevers or chills.  She also says that she has heartburn.  She denies nausea, vomiting, fevers, chills, cough, or shortness of breath.  Past Medical History  Diagnosis Date  . Arthritis     No past surgical history on file.  No family history on file.  History  Substance Use Topics  . Smoking status: Never Smoker   . Smokeless tobacco: Not on file  . Alcohol Use: No    OB History    Grav Para Term Preterm Abortions TAB SAB Ect Mult Living                  Review of Systems  Constitutional: Negative for fever and chills.  Respiratory: Negative for cough, chest tightness and shortness of breath.   Cardiovascular: Negative for chest pain.  Gastrointestinal: Negative for nausea and vomiting.       Heartburn  Genitourinary: Negative for dysuria.  Musculoskeletal: Positive for joint swelling.       Bilateral knee pain  Neurological: Positive for weakness. Negative for headaches.  Psychiatric/Behavioral: Negative for confusion.    Allergies  Review of patient's allergies indicates no known allergies.  Home Medications   Current Outpatient Rx    Name Route Sig Dispense Refill  . ACETAMINOPHEN 325 MG PO TABS Oral Take 650 mg by mouth every 6 (six) hours as needed. For pain    . CALCIUM CARBONATE ANTACID 500 MG PO CHEW Oral Chew 2 tablets by mouth daily.    . ENALAPRIL MALEATE 20 MG PO TABS       . ETODOLAC ER 400 MG PO TB24  take 1 tablet by mouth twice a day 62 tablet 6  . FUROSEMIDE 40 MG PO TABS Oral Take 40 mg by mouth daily.    Marland Kitchen METOPROLOL SUCCINATE ER 100 MG PO TB24 Oral Take 1 tablet (100 mg total) by mouth daily. 30 tablet 11    BP 127/57  Pulse 78  Temp(Src) 97.7 F (36.5 C) (Oral)  Resp 18  SpO2 100%  Physical Exam  Constitutional: She is oriented to person, place, and time.       Morbidly obese, elderly female, in no apparent distress  HENT:  Head: Normocephalic and atraumatic.  Eyes: Conjunctivae are normal. Pupils are equal, round, and reactive to light.  Neck: Normal range of motion. Neck supple.  Pulmonary/Chest: Effort normal. No respiratory distress.  Musculoskeletal: Normal range of motion. She exhibits edema.       Mild bilateral knee tenderness to palpation with no overlying erythema, ecchymoses, or rash.  Full range of motion with mild increase in pain, but no  instability  Bilateral calf swelling with redness consistent with venous stasis changes  Neurological: She is alert and oriented to person, place, and time.  Skin: Skin is warm and dry.  Psychiatric: She has a normal mood and affect. Thought content normal.    ED Course  Procedures (including critical care time) 78 year old lady, with known osteoarthritis, presents with bilateral knee pain, which is so severe she can hardly walk.  She has no history of trauma.  There is no evidence of systemic infection.  We will give her Percocet for her pain.  There is no indication for radiographs at this time.  She also has heartburn, so we will perform an EKG, and cardiac enzymes, to evaluate for possible acute coronary syndrome.  Urinalysis also will be  performed to evaluate for another etiology for her unsteadiness on her feet   Labs Reviewed  CBC  DIFFERENTIAL  BASIC METABOLIC PANEL  URINALYSIS, ROUTINE W REFLEX MICROSCOPIC   No results found.   No diagnosis found.  9:05 PM sxs resolved.  Pt feels very good and wants to go home.  MDM  Osteoarthritis Dyspepsia. No signs acs or uti.        Nicholes Stairs, MD 12/18/11 2105

## 2011-12-18 NOTE — Telephone Encounter (Signed)
Patient daughter called c/o leg weakness, knee pain and walking difficulty x 2days. She reports good vital and no heart related symptoms. The feel that she is unable to make it into the office and wanted to know if MD was ok with her going to ER via EMS transport. Spoke with Dr. Debby Bud who gave advised ok for EMS transport

## 2011-12-18 NOTE — ED Notes (Signed)
Pt lives alone.  Has family come by to help her.  Has hx arthritis to knees but it is worse and can't ambulate or walk.  Normally uses a walker at home.  EMS reports swelling to knees and legs, but family reported it to be WNL for her.  Vitals by EMS 140/70, 76, 18.   Family is on the way here.

## 2011-12-18 NOTE — ED Notes (Signed)
[id number]<NW> Expected date:12/18/11<BR> Expected time: 2:09 PM<BR> Means of arrival:Ambulance<BR> Comments:<BR> EMS 12 GC- 78 y/o female complaining of Bi-lateral Knee pain from arthritis. Vitals WNL. Family request [facility] for evaluation.

## 2011-12-18 NOTE — ED Notes (Signed)
Pt eating dinner. Will get labs when finished.

## 2011-12-18 NOTE — Discharge Instructions (Signed)
You may have Percocet to control the pain in your knees.  Use Maalox or Mylanta for abdominal pain.  Followup with your physician, for reevaluation.  Return for worse or uncontrolled symptoms

## 2011-12-19 ENCOUNTER — Emergency Department (HOSPITAL_COMMUNITY): Payer: Medicare Other

## 2011-12-19 ENCOUNTER — Encounter (HOSPITAL_COMMUNITY): Payer: Self-pay | Admitting: Emergency Medicine

## 2011-12-19 ENCOUNTER — Other Ambulatory Visit: Payer: Self-pay

## 2011-12-19 ENCOUNTER — Inpatient Hospital Stay (HOSPITAL_COMMUNITY)
Admission: EM | Admit: 2011-12-19 | Discharge: 2011-12-25 | DRG: 312 | Disposition: A | Discharge status: Skilled Nursing Facility | Payer: Medicare Other | Attending: Internal Medicine | Admitting: Internal Medicine

## 2011-12-19 DIAGNOSIS — R600 Localized edema: Secondary | ICD-10-CM | POA: Diagnosis present

## 2011-12-19 DIAGNOSIS — I5032 Chronic diastolic (congestive) heart failure: Secondary | ICD-10-CM | POA: Diagnosis present

## 2011-12-19 DIAGNOSIS — E871 Hypo-osmolality and hyponatremia: Secondary | ICD-10-CM | POA: Diagnosis present

## 2011-12-19 DIAGNOSIS — L0390 Cellulitis, unspecified: Secondary | ICD-10-CM

## 2011-12-19 DIAGNOSIS — I509 Heart failure, unspecified: Secondary | ICD-10-CM | POA: Diagnosis present

## 2011-12-19 DIAGNOSIS — G459 Transient cerebral ischemic attack, unspecified: Secondary | ICD-10-CM | POA: Diagnosis present

## 2011-12-19 DIAGNOSIS — R55 Syncope and collapse: Principal | ICD-10-CM | POA: Diagnosis present

## 2011-12-19 DIAGNOSIS — R609 Edema, unspecified: Secondary | ICD-10-CM

## 2011-12-19 DIAGNOSIS — I872 Venous insufficiency (chronic) (peripheral): Secondary | ICD-10-CM | POA: Diagnosis present

## 2011-12-19 DIAGNOSIS — K219 Gastro-esophageal reflux disease without esophagitis: Secondary | ICD-10-CM | POA: Diagnosis present

## 2011-12-19 DIAGNOSIS — M1710 Unilateral primary osteoarthritis, unspecified knee: Secondary | ICD-10-CM | POA: Diagnosis present

## 2011-12-19 DIAGNOSIS — D638 Anemia in other chronic diseases classified elsewhere: Secondary | ICD-10-CM | POA: Diagnosis present

## 2011-12-19 DIAGNOSIS — D649 Anemia, unspecified: Secondary | ICD-10-CM | POA: Diagnosis present

## 2011-12-19 DIAGNOSIS — L02419 Cutaneous abscess of limb, unspecified: Secondary | ICD-10-CM | POA: Diagnosis present

## 2011-12-19 DIAGNOSIS — R5381 Other malaise: Secondary | ICD-10-CM | POA: Diagnosis present

## 2011-12-19 HISTORY — DX: Heart failure, unspecified: I50.9

## 2011-12-19 HISTORY — DX: Essential (primary) hypertension: I10

## 2011-12-19 HISTORY — DX: Hyperlipidemia, unspecified: E78.5

## 2011-12-19 LAB — URINALYSIS, ROUTINE W REFLEX MICROSCOPIC
Glucose, UA: NEGATIVE mg/dL
Nitrite: POSITIVE — AB
Specific Gravity, Urine: 1.014 (ref 1.005–1.030)
pH: 6 (ref 5.0–8.0)

## 2011-12-19 LAB — POCT I-STAT, CHEM 8
Calcium, Ion: 1.12 mmol/L (ref 1.12–1.32)
Creatinine, Ser: 1.6 mg/dL — ABNORMAL HIGH (ref 0.50–1.10)
Glucose, Bld: 103 mg/dL — ABNORMAL HIGH (ref 70–99)
HCT: 32 % — ABNORMAL LOW (ref 36.0–46.0)
Hemoglobin: 10.9 g/dL — ABNORMAL LOW (ref 12.0–15.0)
TCO2: 26 mmol/L (ref 0–100)

## 2011-12-19 LAB — URINE MICROSCOPIC-ADD ON

## 2011-12-19 LAB — COMPREHENSIVE METABOLIC PANEL
AST: 15 U/L (ref 0–37)
Albumin: 3.5 g/dL (ref 3.5–5.2)
Alkaline Phosphatase: 49 U/L (ref 39–117)
BUN: 42 mg/dL — ABNORMAL HIGH (ref 6–23)
Potassium: 4.5 mEq/L (ref 3.5–5.1)
Sodium: 132 mEq/L — ABNORMAL LOW (ref 135–145)
Total Protein: 6.9 g/dL (ref 6.0–8.3)

## 2011-12-19 LAB — DIFFERENTIAL
Basophils Absolute: 0 10*3/uL (ref 0.0–0.1)
Basophils Relative: 0 % (ref 0–1)
Eosinophils Absolute: 0.2 10*3/uL (ref 0.0–0.7)
Monocytes Relative: 7 % (ref 3–12)
Neutro Abs: 7.4 10*3/uL (ref 1.7–7.7)
Neutrophils Relative %: 76 % (ref 43–77)

## 2011-12-19 LAB — TROPONIN I: Troponin I: <0.3 ng/mL (ref <0.30)

## 2011-12-19 LAB — CBC
MCH: 32.5 pg (ref 26.0–34.0)
MCHC: 33.2 g/dL (ref 30.0–36.0)
Platelets: 347 10*3/uL (ref 150–400)
RDW: 13 % (ref 11.5–15.5)

## 2011-12-19 NOTE — ED Notes (Signed)
Patient here from home by EMS with c/o generalized weakness. Per family, reports patient was eating and shortly after she finished eating she slumped over in the chair and passed out. Patient does not remember what happened. Currently c/o sore bilateral lower leg pain. BLE are swollen red and have hard callouses on lower portion of bilat. Legs.

## 2011-12-19 NOTE — ED Notes (Signed)
Patient transported to CT 

## 2011-12-19 NOTE — ED Notes (Signed)
Pt here via EMS with c/o weakness x 1 hour. Pt sitting, began to get nauseous. Pt hypotensive, 80/50 on ems arrival. Pt received 250 cc bolus. And bp increased to 170/80. HR 60's.

## 2011-12-19 NOTE — ED Provider Notes (Addendum)
History     CSN: [id number]  Arrival date & time 12/19/11  1908   First MD Initiated Contact with Patient 12/19/11 2006      Chief Complaint  Patient presents with  . Weakness     HPI  History provided by the patient, family and EMS. Patient is a 78 year old female with history of hypertension, hyperlipidemia, CHF, osteoarthritis, anemia who presents following a syncopal episode. Patient was seen yesterday for complaints of knee pain related to her osteoarthritis. Family states she has history of severe OA in her knee. Patient also had some symptoms of shortness of breath. She was evaluated for these symptoms with no signs for an emergent cause. She was given prescription for Percocet to use for her joint pains. Family reports that patient took 1 tab at 10 AM and a second tablet 2 PM today. This evening patient was sitting down for dinner. She had gotten up to go to the kitchen and then returned to be seen again and had acute onset of lightheadedness, nausea followed by a brief syncopal episode. Family called EMS and stated that when they arrived her blood pressure was 80/50. Patient received 250 cc IV fluid with improvement of blood pressure. Since this time patient reports that she has felt better. She complains of slight ache in the left lateral chest wall area. Patient remained seated during the episode and did not fall or have any secondary injuries.   Past Medical History  Diagnosis Date  . Arthritis   . Hypertension   . Hyperlipidemia   . CHF (congestive heart failure)     Past Surgical History  Procedure Date  . Joint replacement     No family history on file.  History  Substance Use Topics  . Smoking status: Never Smoker   . Smokeless tobacco: Not on file  . Alcohol Use: No    OB History    Grav Para Term Preterm Abortions TAB SAB Ect Mult Living                  Review of Systems  Constitutional: Negative for fever, chills and diaphoresis.  Respiratory:  Negative for cough and shortness of breath.   Cardiovascular: Positive for chest pain. Negative for palpitations.  Gastrointestinal: Positive for nausea. Negative for vomiting and abdominal pain.    Allergies  Review of patient's allergies indicates no known allergies.  Home Medications   Current Outpatient Rx  Name Route Sig Dispense Refill  . ACETAMINOPHEN 325 MG PO TABS Oral Take 650 mg by mouth every 6 (six) hours as needed. For pain    . CALCIUM CARBONATE ANTACID 500 MG PO CHEW Oral Chew 2 tablets by mouth daily.    . ENALAPRIL MALEATE 20 MG PO TABS       . ETODOLAC ER 400 MG PO TB24  take 1 tablet by mouth twice a day 62 tablet 6  . FUROSEMIDE 40 MG PO TABS Oral Take 40 mg by mouth daily.    Marland Kitchen METOPROLOL SUCCINATE ER 100 MG PO TB24 Oral Take 1 tablet (100 mg total) by mouth daily. 30 tablet 11  . OXYCODONE-ACETAMINOPHEN 5-325 MG PO TABS Oral Take 2 tablets by mouth every 4 (four) hours as needed for pain. 15 tablet 0    BP 168/81  Pulse 65  Temp(Src) 98.2 F (36.8 C) (Oral)  Resp 20  SpO2 100%  Physical Exam  Nursing note and vitals reviewed. Constitutional: She is oriented to person, place, and time. She  appears well-developed and well-nourished. No distress.  HENT:  Head: Normocephalic.  Cardiovascular: Normal rate and regular rhythm.   Pulmonary/Chest: Effort normal and breath sounds normal. No respiratory distress. She has no wheezes. She has no rales.  Abdominal: Soft. She exhibits no distension. There is no rebound.       Mild diffuse tenderness  Musculoskeletal: She exhibits edema. She exhibits no tenderness.  Neurological: She is alert and oriented to person, place, and time.  Skin: Skin is warm and dry. No rash noted.  Psychiatric: She has a normal mood and affect. Her behavior is normal.    ED Course  Procedures   Results for orders placed during the hospital encounter of 12/19/11  CBC      Component Value Range   WBC 9.7  4.0 - 10.5 (K/uL)   RBC 3.26  (*) 3.87 - 5.11 (MIL/uL)   Hemoglobin 10.6 (*) 12.0 - 15.0 (g/dL)   HCT 47.8 (*) 29.5 - 46.0 (%)   MCV 97.9  78.0 - 100.0 (fL)   MCH 32.5  26.0 - 34.0 (pg)   MCHC 33.2  30.0 - 36.0 (g/dL)   RDW 62.1  30.8 - 65.7 (%)   Platelets 347  150 - 400 (K/uL)  DIFFERENTIAL      Component Value Range   Neutrophils Relative 76  43 - 77 (%)   Neutro Abs 7.4  1.7 - 7.7 (K/uL)   Lymphocytes Relative 15  12 - 46 (%)   Lymphs Abs 1.4  0.7 - 4.0 (K/uL)   Monocytes Relative 7  3 - 12 (%)   Monocytes Absolute 0.7  0.1 - 1.0 (K/uL)   Eosinophils Relative 2  0 - 5 (%)   Eosinophils Absolute 0.2  0.0 - 0.7 (K/uL)   Basophils Relative 0  0 - 1 (%)   Basophils Absolute 0.0  0.0 - 0.1 (K/uL)  COMPREHENSIVE METABOLIC PANEL      Component Value Range   Sodium 132 (*) 135 - 145 (mEq/L)   Potassium 4.5  3.5 - 5.1 (mEq/L)   Chloride 95 (*) 96 - 112 (mEq/L)   CO2 27  19 - 32 (mEq/L)   Glucose, Bld 155 (*) 70 - 99 (mg/dL)   BUN 42 (*) 6 - 23 (mg/dL)   Creatinine, Ser 8.46 (*) 0.50 - 1.10 (mg/dL)   Calcium 8.9  8.4 - 96.2 (mg/dL)   Total Protein 6.9  6.0 - 8.3 (g/dL)   Albumin 3.5  3.5 - 5.2 (g/dL)   AST 15  0 - 37 (U/L)   ALT 7  0 - 35 (U/L)   Alkaline Phosphatase 49  39 - 117 (U/L)   Total Bilirubin 0.2 (*) 0.3 - 1.2 (mg/dL)   GFR calc non Af Amer 30 (*) >90 (mL/min)   GFR calc Af Amer 35 (*) >90 (mL/min)  TROPONIN I      Component Value Range   Troponin I <0.30  <0.30 (ng/mL)  URINALYSIS, ROUTINE W REFLEX MICROSCOPIC      Component Value Range   Color, Urine YELLOW  YELLOW    APPearance CLOUDY (*) CLEAR    Specific Gravity, Urine 1.014  1.005 - 1.030    pH 6.0  5.0 - 8.0    Glucose, UA NEGATIVE  NEGATIVE (mg/dL)   Hgb urine dipstick MODERATE (*) NEGATIVE    Bilirubin Urine LARGE (*) NEGATIVE    Ketones, ur NEGATIVE  NEGATIVE (mg/dL)   Protein, ur NEGATIVE  NEGATIVE (mg/dL)   Urobilinogen, UA  0.2  0.0 - 1.0 (mg/dL)   Nitrite POSITIVE (*) NEGATIVE    Leukocytes, UA SMALL (*) NEGATIVE   URINE  MICROSCOPIC-ADD ON      Component Value Range   Squamous Epithelial / LPF RARE  RARE    WBC, UA 0-2  <3 (WBC/hpf)   RBC / HPF 0-2  <3 (RBC/hpf)   Bacteria, UA MANY (*) RARE      Dg Chest 2 View  12/19/2011  *RADIOLOGY REPORT*  Clinical Data: Weakness.  CHEST - 2 VIEW  Comparison: 03/30/2010  Findings: Heart size upper normal.  Negative for heart failure. Lungs are clear without infiltrate or effusion.  Hiatal hernia with air-fluid level.  IMPRESSION: No acute cardiopulmonary disease.  Original Report Authenticated By: Camelia Phenes, M.D.   Ct Head Wo Contrast  12/19/2011  *RADIOLOGY REPORT*  Clinical Data: Syncope.  Weakness.  CT HEAD WITHOUT CONTRAST  Technique:  Contiguous axial images were obtained from the base of the skull through the vertex without contrast.  Comparison: 03/30/2010  Findings: There is no acute intracranial hemorrhage, infarction, or mass lesion.  The patient has severe atrophy with marked ventricular dilatation.  There has been no change since the prior study.  No acute osseous abnormality.  IMPRESSION: No acute abnormalities.  Severe atrophy.  Original Report Authenticated By: Gwynn Burly, M.D.     1. Syncope       MDM  8:10 PM patient seen and evaluated. Patient in no acute distress.  Pt was seen and discussed with Attending Physician.  Plan to admit for possible TIA work up vs syncope.  Spoke with triad hospitalist. They will see patient and admit to team 3 telemetry bed.   Date: 12/19/2011  Rate: 74  Rhythm: normal sinus rhythm  QRS Axis: normal  Intervals: normal  ST/T Wave abnormalities: nonspecific T wave changes  Conduction Disutrbances:none  Narrative Interpretation:   Old EKG Reviewed: unchanged from 12/18/2011      Angus Seller, PA 12/20/11 0041  Angus Seller, PA 02/05/12 [phone]

## 2011-12-20 ENCOUNTER — Encounter (HOSPITAL_COMMUNITY): Payer: Self-pay

## 2011-12-20 ENCOUNTER — Emergency Department (HOSPITAL_COMMUNITY): Payer: Medicare Other

## 2011-12-20 DIAGNOSIS — R600 Localized edema: Secondary | ICD-10-CM | POA: Diagnosis present

## 2011-12-20 DIAGNOSIS — L0390 Cellulitis, unspecified: Secondary | ICD-10-CM | POA: Diagnosis present

## 2011-12-20 DIAGNOSIS — I872 Venous insufficiency (chronic) (peripheral): Secondary | ICD-10-CM | POA: Diagnosis present

## 2011-12-20 DIAGNOSIS — R55 Syncope and collapse: Secondary | ICD-10-CM

## 2011-12-20 DIAGNOSIS — I359 Nonrheumatic aortic valve disorder, unspecified: Secondary | ICD-10-CM

## 2011-12-20 DIAGNOSIS — D649 Anemia, unspecified: Secondary | ICD-10-CM | POA: Diagnosis present

## 2011-12-20 DIAGNOSIS — R609 Edema, unspecified: Secondary | ICD-10-CM

## 2011-12-20 DIAGNOSIS — E871 Hypo-osmolality and hyponatremia: Secondary | ICD-10-CM | POA: Diagnosis present

## 2011-12-20 DIAGNOSIS — G459 Transient cerebral ischemic attack, unspecified: Secondary | ICD-10-CM

## 2011-12-20 LAB — RAPID URINE DRUG SCREEN, HOSP PERFORMED: Barbiturates: NOT DETECTED

## 2011-12-20 LAB — CARDIAC PANEL(CRET KIN+CKTOT+MB+TROPI)
CK, MB: 3.1 ng/mL (ref 0.3–4.0)
CK, MB: 2.7 ng/mL (ref 0.3–4.0)
Relative Index: INVALID (ref 0.0–2.5)
Relative Index: INVALID (ref 0.0–2.5)
Total CK: 82 U/L (ref 7–177)
Total CK: 75 U/L (ref 7–177)
Troponin I: <0.3 ng/mL (ref <0.30)

## 2011-12-20 LAB — LIPID PANEL
Cholesterol: 195 mg/dL (ref 0–200)
HDL: 63 mg/dL (ref >39)
Total CHOL/HDL Ratio: 3.1 RATIO
Triglycerides: 143 mg/dL (ref <150)
VLDL: 29 mg/dL (ref 0–40)

## 2011-12-20 LAB — IRON AND TIBC
Iron: 41 ug/dL — ABNORMAL LOW (ref 42–135)
Saturation Ratios: 16 % — ABNORMAL LOW (ref 20–55)
UIBC: 217 ug/dL (ref 125–400)

## 2011-12-20 LAB — GLUCOSE, CAPILLARY: Glucose-Capillary: 124 mg/dL — ABNORMAL HIGH (ref 70–99)

## 2011-12-20 LAB — FOLATE: Folate: 9.5 ng/mL

## 2011-12-20 MED ORDER — FUROSEMIDE 20 MG PO TABS
20.0000 mg | ORAL_TABLET | Freq: Two times a day (BID) | ORAL | Status: DC
  Administered 2011-12-20 – 2011-12-25 (×10): 20 mg via ORAL
  Filled 2011-12-20 (×13): qty 1

## 2011-12-20 MED ORDER — METOPROLOL SUCCINATE ER 100 MG PO TB24
100.0000 mg | ORAL_TABLET | Freq: Every day | ORAL | Status: DC
  Administered 2011-12-20 – 2011-12-25 (×6): 100 mg via ORAL
  Filled 2011-12-20 (×6): qty 1

## 2011-12-20 MED ORDER — ACETAMINOPHEN 325 MG PO TABS
650.0000 mg | ORAL_TABLET | Freq: Four times a day (QID) | ORAL | Status: DC | PRN
  Administered 2011-12-20: 650 mg via ORAL
  Filled 2011-12-20: qty 2

## 2011-12-20 MED ORDER — FAMOTIDINE 20 MG PO TABS
20.0000 mg | ORAL_TABLET | Freq: Two times a day (BID) | ORAL | Status: DC
  Administered 2011-12-20 – 2011-12-25 (×12): 20 mg via ORAL
  Filled 2011-12-20 (×14): qty 1

## 2011-12-20 MED ORDER — ASPIRIN 325 MG PO TABS
325.0000 mg | ORAL_TABLET | Freq: Every day | ORAL | Status: DC
  Administered 2011-12-20 – 2011-12-25 (×6): 325 mg via ORAL
  Filled 2011-12-20 (×8): qty 1

## 2011-12-20 MED ORDER — ENOXAPARIN SODIUM 30 MG/0.3ML ~~LOC~~ SOLN
30.0000 mg | SUBCUTANEOUS | Status: DC
  Administered 2011-12-20 – 2011-12-25 (×6): 30 mg via SUBCUTANEOUS
  Filled 2011-12-20 (×6): qty 0.3

## 2011-12-20 MED ORDER — CALCIUM CARBONATE ANTACID 500 MG PO CHEW
2.0000 | CHEWABLE_TABLET | Freq: Every day | ORAL | Status: DC
  Administered 2011-12-20 – 2011-12-25 (×6): 400 mg via ORAL
  Filled 2011-12-20 (×6): qty 2

## 2011-12-20 MED ORDER — SODIUM CHLORIDE 0.9 % IV SOLN
3.0000 g | Freq: Once | INTRAVENOUS | Status: AC
  Administered 2011-12-20: 3 g via INTRAVENOUS
  Filled 2011-12-20: qty 3

## 2011-12-20 MED ORDER — AMPICILLIN-SULBACTAM SODIUM 3 (2-1) G IJ SOLR
3.0000 g | Freq: Two times a day (BID) | INTRAMUSCULAR | Status: DC
  Administered 2011-12-21 – 2011-12-24 (×7): 3 g via INTRAVENOUS
  Filled 2011-12-20 (×10): qty 3

## 2011-12-20 NOTE — Progress Notes (Signed)
ANTIBIOTIC CONSULT NOTE - INITIAL  Pharmacy Consult for Unasyn Indication: Cellulitis  No Known Allergies  Patient Measurements: Height: 5\' 5"  (165.1 cm) Weight: 190 lb (86.183 kg) IBW/kg (Calculated) : 57  Adjusted Body Weight:   Vital Signs: Temp: 98 F (36.7 C) (03/07 0429) Temp src: Oral (03/07 0429) BP: 163/60 mmHg (03/07 0429) Pulse Rate: 72  (03/07 0429) Intake/Output from previous day: 03/06 0701 - 03/07 0700 In: -  Out: 426 [Urine:426] Intake/Output from this shift: Total I/O In: -  Out: 426 [Urine:426]  Labs:  [facility] 12/19/11 2026 12/18/11 1930 12/18/11 1923  WBC 9.7 -- 8.4  HGB 10.6* 10.9* 9.8*  PLT 347 -- 381  LABCREA -- -- --  CREATININE 1.52* 1.60* 1.30*   Estimated Creatinine Clearance: 29.3 ml/min (by C-G formula based on Cr of 1.52). No results found for this basename: VANCOTROUGH:2,VANCOPEAK:2,VANCORANDOM:2,GENTTROUGH:2,GENTPEAK:2,GENTRANDOM:2,TOBRATROUGH:2,TOBRAPEAK:2,TOBRARND:2,AMIKACINPEAK:2,AMIKACINTROU:2,AMIKACIN:2, in the last 72 hours   Microbiology: No results found for this or any previous visit (from the past 720 hour(s)).  Medical History: Past Medical History  Diagnosis Date  . Arthritis   . Hypertension   . Hyperlipidemia   . CHF (congestive heart failure)     Medications:  Anti-infectives     Start     Dose/Rate Route Frequency Ordered Stop   12/20/11 0045   Ampicillin-Sulbactam (UNASYN) 3 g in sodium chloride 0.9 % 100 mL IVPB        3 g 100 mL/hr over 60 Minutes Intravenous  Once 12/20/11 0031 12/20/11 0202         Assessment: Patient with cellulitis and poor renal function currently just below 20mL/min  Goal of Therapy:  Unasyn dosed based on patient weight and renal function   Plan:  Unasyn 3gm iv q12hr, follow renal function and adjust dose if needed.  Darlina Guys, Jacquenette Shone Crowford 12/20/2011,5:50 AM

## 2011-12-20 NOTE — ED Provider Notes (Signed)
Medical screening examination/treatment/procedure(s) were conducted as a shared visit with non-physician practitioner(s) and myself.  I personally evaluated the patient during the encounter  Syncopal episode while sitting at dinner table.  LIghtheaded, nausea. No chest pain.  Starter percocet yesterday for knee pain. Reportedly hypotensive on EMS arrival but no vomiting or diarrhea.  Back to baseline now. Neuro intact.  Syncope v seizure v TIA.  Glynn Octave, MD 12/20/11 [phone]

## 2011-12-20 NOTE — ED Notes (Signed)
Prior to leaving for MRI, pt advised she was wet, i.e. Urine. Pt cleaned of urine and underpads changed. New gown on pt.

## 2011-12-20 NOTE — Progress Notes (Signed)
  Echocardiogram 2D Echocardiogram has been performed.  [name] [facility] 12/20/2011, 10:30 AM

## 2011-12-20 NOTE — Progress Notes (Signed)
Subjective: Patient had a syncopal episode at the dining table last night and was subsequently admitted to r/o stroke. She had been seen in ED March 5th for knee pain. She by report had no incontinence, no post-ictal period.  This AM she awakens easily. She is confused and has poor recollection of the events leading up to her admission. She denies any pain except for her legs.  Objective: Lab: Lab Results  Component Value Date   WBC 9.7 12/19/2011   HGB 10.6* 12/19/2011   HCT 31.9* 12/19/2011   MCV 97.9 12/19/2011   PLT 347 12/19/2011   BMET    Component Value Date/Time   NA 132* 12/19/2011 2026   K 4.5 12/19/2011 2026   CL 95* 12/19/2011 2026   CO2 27 12/19/2011 2026   GLUCOSE 155* 12/19/2011 2026   BUN 42* 12/19/2011 2026   CREATININE 1.52* 12/19/2011 2026   CALCIUM 8.9 12/19/2011 2026   GFRNONAA 30* 12/19/2011 2026   GFRAA 35* 12/19/2011 2026   Urinalysis    Component Value Date/Time   COLORURINE YELLOW 12/19/2011 2115   APPEARANCEUR CLOUDY* 12/19/2011 2115   LABSPEC 1.014 12/19/2011 2115   PHURINE 6.0 12/19/2011 2115   GLUCOSEU NEGATIVE 12/19/2011 2115   HGBUR MODERATE* 12/19/2011 2115   BILIRUBINUR LARGE* 12/19/2011 2115   KETONESUR NEGATIVE 12/19/2011 2115   PROTEINUR NEGATIVE 12/19/2011 2115   UROBILINOGEN 0.2 12/19/2011 2115   NITRITE POSITIVE* 12/19/2011 2115   LEUKOCYTESUR SMALL* 12/19/2011 2115      pBNP 917.0 Retic count 33, % 1.1  Imaging: CT brain - no acute changes MRI brain - atrophy, enlarged ventricles, no gross evidence of stroke - preliminary read by attending. Radiology reading pending.   Physical Exam: Filed Vitals:   12/20/11 0429  BP: 163/60  Pulse: 72  Temp: 98 F (36.7 C)  Resp: 19   Gen'l- elderly, overweight white woman in no acute distress HEENT - [place]/AT, C&S clear Pulm - normal respirations w/o rales or wheezes Cor 2+ raidal pulse, no JVD, no carotid bruits, RRR Abd- BS+ soft,  Neuro - Awakens easily, oriented to person, examiner and place; speech is clear; recollection  is confused w/ mild confablulation. CN - normal facial symmetry, PERRLA, EOMI, normal vision but not formerly tested, no tongue fasiculations. MS- MAE to command. Cerebellar - no tremor    Assessment/Plan: 1. Neuro - episode of syncope w/o signs of seizure. Exam is non-focal and CT negative. MRI reading pending but appears w/o acute injury. Marked hydrocephalus. Some confusion.  Plan- complete stroke w/u.           2. ID - patient with eythema of distal LE concerning for stasis dermatitis with cellulitis. Also  postive U/A. Unasyn started.  Plan - continue unasyn for now with plans to convert to PO antibiotics  3. CHF - mild elevation in pBNP. Admitting MD started lasix. 2 D echo ordered  Plan - follow I&Os, daily weights  4. Anemia - chronic. She has been stable for the last year.   Illene Regulus 12/20/2011, 7:46 AM

## 2011-12-20 NOTE — ED Notes (Signed)
REPORT CALLED TO 5 EAST NIKKI RN STATED SHE WILL CALLED BACK

## 2011-12-20 NOTE — H&P (Signed)
PCP:   Illene Regulus, MD, MD   Chief Complaint:  Syncope/ confusion.  HPI: 78 year old lady lives by herself is brought in by family for an episode of syncope with loss of consciousness lasting for a few minutes at supper . Asper the family, pt was sitting at the dinner table and she reports not feeling good and slumped to the side. She reports feeling sick to her stomach and blurry vision for a few minutes and was confused . She remains confused and does nt remember the episode happening. Se denies any chest pain. She had two falls in the last few years, where she did not have LOC. She denied any headache or urinary incontinence after the syncope. On arrival to ED she was found to be hypotensive which has resolved with fluid bolus. She is a pt of Dr Debby Bud and will request Dr Debby Bud to take over the patient in am. She is currently being admitted to the hospital for evaluation of TIA vs syncope.   Review of Systems:  The patient denies anorexia, fever, weight loss,, hoarseness, chest pain, syncope, dyspnea on exertion,  balance deficits, hemoptysis, abdominal pain, melena, hematochezia, , hematuria, incontinence, genital sores, muscle weakness,  difficulty walking, depression, unusual weight change, abnormal bleeding, enlarged lymph nodes, angioedema, and breast masses.  Past Medical History: Past Medical History  Diagnosis Date  . Arthritis   . Hypertension   . Hyperlipidemia   . CHF (congestive heart failure)    Past Surgical History  Procedure Date  . Joint replacement     Medications: Prior to Admission medications   Medication Sig Start Date End Date Taking? Authorizing Provider  acetaminophen (TYLENOL) 325 MG tablet Take 650 mg by mouth every 6 (six) hours as needed. For pain   Yes Historical Provider, MD  calcium carbonate (TUMS - DOSED IN MG ELEMENTAL CALCIUM) 500 MG chewable tablet Chew 2 tablets by mouth daily.   Yes Historical Provider, MD  enalapril (VASOTEC) 20 MG tablet    02/22/11  Yes Duke Salvia, MD  etodolac (LODINE XL) 400 MG 24 hr tablet take 1 tablet by mouth twice a day 09/27/11  Yes Duke Salvia, MD  furosemide (LASIX) 40 MG tablet Take 40 mg by mouth daily.   Yes Historical Provider, MD  metoprolol (TOPROL XL) 100 MG 24 hr tablet Take 1 tablet (100 mg total) by mouth daily. 01/25/11 01/25/12 Yes Duke Salvia, MD  oxyCODONE-acetaminophen (PERCOCET) 5-325 MG per tablet Take 2 tablets by mouth every 4 (four) hours as needed for pain. 12/18/11 12/28/11 Yes Nicholes Stairs, MD    Allergies:  No Known Allergies  Social History:  reports that she has never smoked. She does not have any smokeless tobacco history on file. She reports that she does not drink alcohol or use illicit drugs.  Family History: No family history on file.  Physical Exam: Filed Vitals:   12/19/11 1934 12/19/11 2054 12/19/11 2055 12/19/11 2100  BP: 168/81 153/59 150/63 155/96  Pulse: 65 76 75 76  Temp: 98.2 F (36.8 C)     TempSrc: Oral     Resp: 20 16 16 18   SpO2: 100%      Constitutional: Vital signs reviewed.  Patient is a well-developed and well-nourished in no acute distress and cooperative with exam. Alert but confused..  Head: Normocephalic and atraumatic Mouth: no erythema or exudates, MMM Eyes: PERRL, EOMI, conjunctivae normal, No scleral icterus.  Neck: Supple, Trachea midline normal ROM, No JVD,  mass, thyromegaly, or carotid bruit present.  Cardiovascular: RRR, S1 normal, S2 normal, no MRG, pulses symmetric and intact bilaterally Pulmonary/Chest: CTAB, no wheezes, rales, or rhonchi Abdominal: Soft. Non-tender, non-distended, bowel sounds are normal, no masses, organomegaly, or guarding present.  Musculoskeletal: 3 + pedal edema, with erythema and tenderness upto the middle of the leg with chronic venous stasis changes.  Hematology: no cervical, inginal, or axillary adenopathy.  Neurological: Alert but confused. Strenght is normal and  symmetric bilaterally, cranial nerve II-XII are grossly intact, no focal motor deficit, sensory intact to light touch bilaterally.       Labs on Admission:   [facility] 12/19/11 2026 12/18/11 1930 12/18/11 1923  NA 132* 135 --  K 4.5 3.8 --  CL 95* 100 --  CO2 27 -- 28  GLUCOSE 155* 103* --  BUN 42* 37* --  CREATININE 1.52* 1.60* --  CALCIUM 8.9 -- 9.6  MG -- -- --  PHOS -- -- --    Basename 12/19/11 2026  AST 15  ALT 7  ALKPHOS 49  BILITOT 0.2*  PROT 6.9  ALBUMIN 3.5   No results found for this basename: LIPASE:2,AMYLASE:2 in the last 72 hours  Basename 12/19/11 2026 12/18/11 1930 12/18/11 1923  WBC 9.7 -- 8.4  NEUTROABS 7.4 -- 5.3  HGB 10.6* 10.9* --  HCT 31.9* 32.0* --  MCV 97.9 -- 96.7  PLT 347 -- 381    Basename 12/19/11 2026  CKTOTAL --  CKMB --  CKMBINDEX --  TROPONINI <0.30   No results found for this basename: TSH,T4TOTAL,FREET3,T3FREE,THYROIDAB in the last 72 hours No results found for this basename: VITAMINB12:2,FOLATE:2,FERRITIN:2,TIBC:2,IRON:2,RETICCTPCT:2 in the last 72 hours  Radiological Exams on Admission: Dg Chest 2 View  12/19/2011  *RADIOLOGY REPORT*  Clinical Data: Weakness.  CHEST - 2 VIEW  Comparison: 03/30/2010  Findings: Heart size upper normal.  Negative for heart failure. Lungs are clear without infiltrate or effusion.  Hiatal hernia with air-fluid level.  IMPRESSION: No acute cardiopulmonary disease.  Original Report Authenticated By: Camelia Phenes, M.D.   Ct Head Wo Contrast  12/19/2011  *RADIOLOGY REPORT*  Clinical Data: Syncope.  Weakness.  CT HEAD WITHOUT CONTRAST  Technique:  Contiguous axial images were obtained from the base of the skull through the vertex without contrast.  Comparison: 03/30/2010  Findings: There is no acute intracranial hemorrhage, infarction, or mass lesion.  The patient has severe atrophy with marked ventricular dilatation.  There has been no change since the prior study.  No acute osseous abnormality.   IMPRESSION: No acute abnormalities.  Severe atrophy.  Original Report Authenticated By: Gwynn Burly, M.D.    Assessment/Plan Present on Admission:  .Syncope with confusion/TIA (transient ischemic attack): will admit the patient to telemetry and evaluate for TIA. TIA work up ordered,. Initial CT head negative for any acute changes. Mri/MR head and neck, carotid dopplers and 2 d echocardiogram ordered. She was given on e dose of aspirin.  Cardiac enzymes ordered.   Bilateral venous statis Dermatitis with a component of cellulitis: started the patient on unasyn. Venous dopplers ordered  Heartburn? H/o esophagitis: started the patient on pepcid.   CHF: get echo, will start the patient on low dose  Lasix. Get probnp.   Anemia: anemia panel ordered.   DVT prophylaxis.  Time spent on this patient including examination and decision-making process: 60 minutes.  Brewster Wolters [phone] 12/20/2011, 12:01 AM

## 2011-12-20 NOTE — ED Notes (Signed)
Patient transported to MRI 

## 2011-12-20 NOTE — Progress Notes (Signed)
*  PRELIMINARY RESULTS* Vascular Ultrasound Carotid Duplex (Doppler) has been completed.  Preliminary findings: Bilaterally no significant ICA stenosis with antegrade vertebral flow.  Lower extremity venous duplex has been completed. Bilaterally no evidence of DVT or baker's cyst.  Farrel Demark RDMS 12/20/2011, 9:11 AM

## 2011-12-20 NOTE — ED Notes (Signed)
Pt returned from MRI. Placed back on cardiac monitor, NIBP, pulse ox.

## 2011-12-20 NOTE — ED Notes (Signed)
NIKKI CALLED UNABLE TO TAKE  REPORT AT THIS TIME WILL CALLED

## 2011-12-21 ENCOUNTER — Other Ambulatory Visit: Payer: Self-pay | Admitting: Internal Medicine

## 2011-12-21 DIAGNOSIS — R55 Syncope and collapse: Secondary | ICD-10-CM

## 2011-12-21 LAB — GLUCOSE, CAPILLARY: Glucose-Capillary: 154 mg/dL — ABNORMAL HIGH (ref 70–99)

## 2011-12-21 NOTE — Progress Notes (Signed)
Subjective: Awake and alert with no complaints.  Objective: Lab: No new lab  Telemetry - no arrhythmia  Imaging: MRI brain  No acute infarct.  9 mm left parietal region meningioma without associated mass  effect.  Mild small vessel disease type changes.  Prominent global atrophy. Ventricular prominence may be related to  atrophy versus mild component hydrocephalus. Appearance is  unchanged. MRA brain IMPRESSION:  Intracranial atherosclerotic type changes most notable involving  the posterior circulation as detailed above.  Original Report Authenticated By: Fuller Canada, M.D. Carotid doppler - no occlusive disease 2 D echo - Impressions:  - Normal LV size with mild LV hypertrophy. EF 55-60%. Aortic sclerosis without stenosis. Biatrial enlargement. Normal RV size and systolic function.   Physical Exam: Filed Vitals:   12/21/11 1042  BP: 139/75  Pulse: 71  Temp: 98.3 F (36.8 C)  Resp: 18  Eldrly white woman in no distress Pulm - normal respirations Cor - RRR Abd - soft Neuro - A&O x 3, speech clear, memory OK; CN II-XII normal; MS - MAE     Assessment/Plan: 1. Neuro - no evidence of stroke. No evidence of predisposing factors. Exam is unremarkable. Plan - PT eval re ability to manage her ADLs/mobility.            2. ID #3 Unasyn for cellulitis/stasis dermatitis Plan - continue another 24 hrs then switch to oral antibiotics -   3. CHF- 2 D echo done with good EF. No respiratory distress or increased work of breathing  4. Anemia - chronic  Dispo - await PT eval. Will add OT eval. Will discuss with daughter recommendations and what support family will be able to provided at home vs long-term SNF/AL   [name] 12/21/2011, 10:50 AM

## 2011-12-21 NOTE — Evaluation (Signed)
Physical Therapy Evaluation Patient Details Name: [name] MRN: [id number] DOB: 03/14/1925 Today's Date: 12/21/2011  Problem List:  Patient Active Problem List  Diagnoses  . VITAMIN B12 DEFICIENCY  . HYPERLIPIDEMIA  . ANEMIA-NOS  . CONJUNCTIVITIS, ACUTE, BILATERAL  . MIXED HEARING LOSS BILATERAL  . HYPERTENSION  . CONGESTIVE HEART FAILURE  . SINUSITIS- ACUTE-NOS  . OSTEOARTHRITIS  . SHOULDER PAIN  . LEG CRAMPS  . PERIPHERAL EDEMA  . URINARY INCONTINENCE, MILD  . Syncope  . TIA (transient ischemic attack)  . Hyponatremia  . Anemia  . Cellulitis  . Pedal edema  . Venous stasis dermatitis    Past Medical History:  Past Medical History  Diagnosis Date  . Arthritis   . Hypertension   . Hyperlipidemia   . CHF (congestive heart failure)    Past Surgical History:  Past Surgical History  Procedure Date  . Joint replacement     PT Assessment/Plan/Recommendation PT Assessment Clinical Impression Statement: Patient is an 78 y.o. female admitted with syncope and now with AMS.  Presents with decreased tolerance to activity, decreased balance, decreased strength and limited independence with mobility.  She will benefit from skilled PT in the acute setting to maximize independence and allow d/c to ILF vs ALF after SNF stay. PT Recommendation/Assessment: Patient will need skilled PT in the acute care venue PT Problem List: Decreased strength;Decreased activity tolerance;Decreased balance;Decreased mobility;Pain;Decreased cognition PT Therapy Diagnosis : Generalized weakness;Abnormality of gait PT Plan PT Frequency: Min 3X/week PT Treatment/Interventions: DME instruction;Gait training;Patient/family education;Functional mobility training;Therapeutic activities;Therapeutic exercise;Balance training PT Recommendation Equipment Recommended: Defer to next venue PT Goals  Acute Rehab PT Goals PT Goal Formulation: With patient/family Time For Goal Achievement: 2 weeks Pt will go  Supine/Side to Sit: with min assist PT Goal: Supine/Side to Sit - Progress: Goal set today Pt will Sit at [facility] of Bed: with modified independence;with unilateral upper extremity support;3-5 min PT Goal: Sit at Edge Of Bed - Progress: Goal set today Pt will go Sit to Supine/Side: with mod assist PT Goal: Sit to Supine/Side - Progress: Goal set today Pt will go Sit to Stand: with min assist PT Goal: Sit to Stand - Progress: Goal set today Pt will go Stand to Sit: with min assist PT Goal: Stand to Sit - Progress: Goal set today Pt will Transfer Bed to Chair/Chair to Bed: with min assist PT Transfer Goal: Bed to Chair/Chair to Bed - Progress: Goal set today Pt will Ambulate: 16 - 50 feet;with least restrictive assistive device;with mod assist PT Goal: Ambulate - Progress: Goal set today  PT Evaluation Precautions/Restrictions  Precautions Precautions: Fall Precaution Comments: patient fearful of falling Prior Functioning  Home Living Lives With: Alone Receives Help From: Family (son and daughter were checking on patient multiple x's a day) Type of Home: House Home Layout: One level Home Access: Level entry Home Adaptive Equipment: Dan Humphreys - four wheeled;Walker - rolling;Bedside commode/3-in-1 Prior Function Level of Independence: Requires assistive device for independence;Independent with gait;Needs assistance with tranfers;Needs assistance with homemaking;Independent with basic ADLs;Independent with transfers (could transfer I from high chair; sponge bathed only) Comments: daughter reports they do not plan for patient to return home; will look into ALF/ILF after SNF Cognition Cognition Arousal/Alertness: Awake/alert Overall Cognitive Status: Impaired Attention: Impaired Current Attention Level: Sustained Memory: Appears impaired Memory Deficits: cannot recall where she is after few minutes Orientation Level: Disoriented to place;Disoriented to time;Disoriented to situation;Oriented  to person Following Commands: Follows one step commands with increased time Sensation/Coordination  Extremity Assessment RLE Assessment RLE Assessment: Exceptions to [facility] RLE AROM (degrees) Overall AROM Right Lower Extremity: Deficits;Due to pain (pain in knees) RLE Strength RLE Overall Strength Comments: hip flexion 3/5; knee extension 3+/5, ankle dorsiflexion 4/5 LLE Assessment LLE Assessment: Exceptions to [facility] LLE AROM (degrees) Overall AROM Left Lower Extremity: Deficits;Due to pain (pain in knees) LLE Strength LLE Overall Strength Comments: hip flexion 3/5; knee extension 3+/5, ankle dorsiflexion 4/5 Mobility (including Balance) Bed Mobility Bed Mobility: Yes Supine to Sit: 2: Max assist Supine to Sit Details (indicate cue type and reason): assist for scooting, getting legs off bed and to lift shoulders (partially due to cognition) Sitting - Scoot to Edge of Bed: 2: Max assist Sitting - Scoot to [employer] of Bed Details (indicate cue type and reason): using pad under patient to scoot Transfers Transfers: Yes Sit to Stand: 3: Mod assist;With upper extremity assist;From elevated surface;From bed Sit to Stand Details (indicate cue type and reason): pushed from bed without cues; elevated height of bed, pt=60% Stand to Sit: 3: Mod assist;With armrests;To chair/3-in-1 Stand to Sit Details: cues for safety as patient reaching for chair before backed up to chair. Stand Pivot Transfers: 3: Mod assist Stand Pivot Transfer Details (indicate cue type and reason): with rolling walker taking little steps to turn with cues for direction.  Posture/Postural Control Posture/Postural Control: Postural limitations Postural Limitations: kyphotic with rounded shoulders and forward head; knees arthritic Balance Balance Assessed: Yes Static Sitting Balance Static Sitting - Balance Support: Bilateral upper extremity supported Static Sitting - Level of Assistance: 5: Stand by assistance;4: Min  assist Static Sitting - Comment/# of Minutes: initially needed assist, then able to sit with supervision Exercise    End of Session PT - End of Session Equipment Utilized During Treatment: Gait belt Activity Tolerance: Patient limited by pain Patient left: in chair;with call bell in reach;with family/visitor present Nurse Communication: Mobility status for transfers General Behavior During Session: [facility] for tasks performed Cognition: Impaired  Seth Friedlander,CYNDI 12/21/2011, 4:42 PM

## 2011-12-22 LAB — GLUCOSE, CAPILLARY
Glucose-Capillary: 95 mg/dL (ref 70–99)
Glucose-Capillary: 115 mg/dL — ABNORMAL HIGH (ref 70–99)
Glucose-Capillary: 114 mg/dL — ABNORMAL HIGH (ref 70–99)

## 2011-12-22 LAB — URINE CULTURE: Culture  Setup Time: 201303070207

## 2011-12-22 NOTE — Progress Notes (Signed)
Subjective: Appreciate PT eval - recommendation for SNF noted. She does want to know what happened to her. She doesn't feel as energetic as usual, although this may be a misperception.  Objective: Lab: no new lab  Imaging:no new imaging  Physical Exam: Filed Vitals:   12/22/11 0530  BP: 137/83  Pulse: 68  Temp: 97.7 F (36.5 C)  Resp: 20  elderly white woman in no distress Cor - RRR Pulm - no increased WOB, no wheezes Abd- soft Ext/derm - decreased erythema and warmth at distal LE. Small "knick" distal right LE with some dried blood. Neuro - A&O x 3, CN II-XII normal, MAE to command.     Assessment/Plan: 1. Neuro - stable and at her baseline. No evidence of stroke.  2. ID #4 Unasyn for cellulitis. No fever. Legs look better.  Plan - continue unasyn while in-patient.  3. CHF - no evidence of decompensation = stable  Dispo- PT recommends SNF and family agrees. AL to follow if she regains some strength and mobility.    Casimiro Needle Dabney Dever 12/22/2011, 10:05 AM

## 2011-12-22 NOTE — Progress Notes (Signed)
Occupational Therapy Evaluation Patient Details Name: [name] MRN: [id number] DOB: 09/24/1925 Today's Date: 12/22/2011  Problem List:  Patient Active Problem List  Diagnoses  . VITAMIN B12 DEFICIENCY  . HYPERLIPIDEMIA  . ANEMIA-NOS  . CONJUNCTIVITIS, ACUTE, BILATERAL  . MIXED HEARING LOSS BILATERAL  . HYPERTENSION  . CONGESTIVE HEART FAILURE  . SINUSITIS- ACUTE-NOS  . OSTEOARTHRITIS  . SHOULDER PAIN  . LEG CRAMPS  . PERIPHERAL EDEMA  . URINARY INCONTINENCE, MILD  . Syncope  . TIA (transient ischemic attack)  . Hyponatremia  . Anemia  . Cellulitis  . Pedal edema  . Venous stasis dermatitis    Past Medical History:  Past Medical History  Diagnosis Date  . Arthritis   . Hypertension   . Hyperlipidemia   . CHF (congestive heart failure)    Past Surgical History:  Past Surgical History  Procedure Date  . Joint replacement     OT Assessment/Plan/Recommendation OT Assessment Clinical Impression Statement: Pt presents to OT with decreased I with all ADL activity s/p hospitalization for syncope.  Pt will need SNF prior to DC. Pt agreeable to SNF OT Recommendation/Assessment: Patient will need skilled OT in the acute care venue OT Problem List: Decreased strength;Decreased activity tolerance;Decreased safety awareness Barriers to Discharge: Decreased caregiver support OT Therapy Diagnosis : Generalized weakness OT Plan OT Frequency: Min 1X/week OT Recommendation Follow Up Recommendations: Skilled nursing facility Equipment Recommended: Defer to next venue Individuals Consulted Consulted and Agree with Results and Recommendations: Patient OT Goals Acute Rehab OT Goals OT Goal Formulation: With patient Time For Goal Achievement: 2 weeks ADL Goals Pt Will Perform Grooming: with supervision;Standing at sink ADL Goal: Grooming - Progress: Goal set today Pt Will Transfer to Toilet: with supervision;Comfort height toilet ADL Goal: Toilet Transfer - Progress: Goal  set today  OT Evaluation Precautions/Restrictions  Precautions Precautions: Fall Precaution Comments: patient fearful of falling Restrictions Weight Bearing Restrictions: No     ADL ADL Grooming: Performed;Brushing hair;Wash/dry face;Minimal assistance Where Assessed - Grooming: Sitting, bed;Unsupported Upper Body Bathing: Simulated Where Assessed - Upper Body Bathing: Sitting, bed;Unsupported Lower Body Bathing: Simulated;Maximal assistance Where Assessed - Lower Body Bathing: Sit to stand from bed Upper Body Dressing: Simulated;Moderate assistance Lower Body Dressing: Simulated;Maximal assistance Where Assessed - Lower Body Dressing: Sit to stand from bed Toilet Transfer Method: Stand pivot;Other (comment) (bed to chair only) Toileting - Clothing Manipulation: Simulated Where Assessed - Toileting Clothing Manipulation: Standing Toileting - Hygiene: +1 Total assistance;Simulated Where Assessed - Toileting Hygiene: Standing Tub/Shower Transfer: Not assessed Vision/Perception  Vision - History Baseline Vision: Wears glasses only for reading Cognition Cognition Arousal/Alertness: Awake/alert Overall Cognitive Status: Appears within functional limits for tasks assessed Orientation Level: Oriented to person;Oriented to place;Oriented to situation Following Commands: Appears intact for tasks assessed    Extremity Assessment RUE Assessment RUE Assessment: Within Functional Limits LUE Assessment LUE Assessment: Within Functional Limits Mobility  Bed Mobility Supine to Sit: 2: Max assist Sitting - Scoot to [place] of Bed: 2: Max assist Transfers Sit to Stand: 2: Max assist;With upper extremity assist Stand to Sit: 3: Mod assist;With upper extremity assist    End of Session OT - End of Session Activity Tolerance: Patient tolerated treatment well Patient left: in chair Nurse Communication: Mobility status for transfers General Behavior During Session: [facility] for tasks  performed Cognition: [facility] for tasks performed   Nela Bascom, [place] 12/22/2011, 2:16 PM

## 2011-12-22 NOTE — Progress Notes (Signed)
ANTIBIOTIC CONSULT NOTE - INITIAL  Pharmacy Consult for Unasyn Indication: Cellulitis  No Known Allergies  Patient Measurements: Height: 5\' 5"  (165.1 cm) Weight: 178 lb 12.7 oz (81.1 kg) IBW/kg (Calculated) : 57   Vital Signs: Temp: 97.7 F (36.5 C) (03/09 0530) Temp src: Oral (03/09 0530) BP: 137/83 mmHg (03/09 0530) Pulse Rate: 68  (03/09 0530) Intake/Output from previous day: 03/08 0701 - 03/09 0700 In: 580 [P.O.:480; IV Piggyback:100] Out: -  Intake/Output from this shift: Total I/O In: 240 [P.O.:240] Out: 300 [Urine:300]  Labs:  [facility] 12/19/11 2026  WBC 9.7  HGB 10.6*  PLT 347  LABCREA --  CREATININE 1.52*   Estimated Creatinine Clearance: 28.4 ml/min (by C-G formula based on Cr of 1.52). No results found for this basename: VANCOTROUGH:2,VANCOPEAK:2,VANCORANDOM:2,GENTTROUGH:2,GENTPEAK:2,GENTRANDOM:2,TOBRATROUGH:2,TOBRAPEAK:2,TOBRARND:2,AMIKACINPEAK:2,AMIKACINTROU:2,AMIKACIN:2, in the last 72 hours   Microbiology: Recent Results (from the past 720 hour(s))  URINE CULTURE     Status: Normal   Collection Time   12/19/11  9:15 PM      Component Value Range Status Comment   Specimen Description URINE, CATHETERIZED   Final    Special Requests NONE   Final    Culture  Setup Time [id number]   Final    Colony Count >=100,000 COLONIES/ML   Final    Culture KLEBSIELLA PNEUMONIAE   Final    Report Status 12/22/2011 FINAL   Final    Organism ID, Bacteria KLEBSIELLA PNEUMONIAE   Final     Medical History: Past Medical History  Diagnosis Date  . Arthritis   . Hypertension   . Hyperlipidemia   . CHF (congestive heart failure)     Medications:  Anti-infectives     Start     Dose/Rate Route Frequency Ordered Stop   12/20/11 1200   Ampicillin-Sulbactam (UNASYN) 3 g in sodium chloride 0.9 % 100 mL IVPB        3 g 100 mL/hr over 60 Minutes Intravenous Every 12 hours 12/20/11 0553     12/20/11 0045   Ampicillin-Sulbactam (UNASYN) 3 g in sodium chloride 0.9 %  100 mL IVPB        3 g 100 mL/hr over 60 Minutes Intravenous  Once 12/20/11 0031 12/20/11 0202         Assessment:  Day 3 Unasyn for cellulitis, klebsiella UTI  CrCl still 28 ml/min  Afebrile  Improved cellulitis per Dr. Debby Bud with plan to continue IV abx's while patient is inpatient  Goal of Therapy:  Unasyn dosed based on patient weight and renal function   Plan:  Continue Unasyn 3g IV q12. Recommend Augmentin 500mg  PO bid when changed to PO   Hessie Knows, PharmD, BCPS pager [phone] 12/22/2011 10:21 AM

## 2011-12-23 LAB — GLUCOSE, CAPILLARY: Glucose-Capillary: 124 mg/dL — ABNORMAL HIGH (ref 70–99)

## 2011-12-23 NOTE — Progress Notes (Signed)
Subjective: Sitting up in bed with no complaints. In good spirits, alert and oriented. Daughter is present: we discussed SNF. She favors [facility]  Objective: Lab: no new lab  Imaging: no new imaging  Physical Exam: Filed Vitals:   12/23/11 0917  BP: 136/71  Pulse: 65  Temp:   Resp:    Alert and oriented Cor - RRR PUlm- normal respirations Derm - decreased erythema and calore to lower extremities.     Assessment/Plan: 1. Neuro - stable 2. ID - #5 unasyn - will d/c in AM 3. CHF resolved  Dispo - SNF   Illene Regulus 12/23/2011, 11:48 AM

## 2011-12-23 NOTE — Progress Notes (Signed)
CSW spoke with patient's daughter, Sandi Raveling (home#: [id number] cell#: [phone]) re: discharge planning. CSW completed FL2 and faxed out to [facility] and provided list of facilities to daughter. Weekday CSW to follow-up with bed offers when available.   FL2 in shadow chart for MD signature.   Unice Bailey, LCSWA [phone]  (Weekend Coverage)

## 2011-12-24 LAB — GLUCOSE, CAPILLARY
Glucose-Capillary: 143 mg/dL — ABNORMAL HIGH (ref 70–99)
Glucose-Capillary: 117 mg/dL — ABNORMAL HIGH (ref 70–99)

## 2011-12-24 MED ORDER — AMOXICILLIN-POT CLAVULANATE 500-125 MG PO TABS
1.0000 | ORAL_TABLET | Freq: Two times a day (BID) | ORAL | Status: DC
  Administered 2011-12-24 – 2011-12-25 (×3): 500 mg via ORAL
  Filled 2011-12-24 (×5): qty 1

## 2011-12-24 NOTE — Progress Notes (Signed)
CSW presented bed offers to family. They plan to do visits today of facilities. They are aware Pt is very close to d/c. CSW making calls on their behalf to facilities to facilitate d/c.  Vennie Homans, [place] 12/24/2011 12:08 PM [phone]

## 2011-12-24 NOTE — Progress Notes (Signed)
Subjective: Awake and alert and wants to go home. Legs still tender, heels itch. Poor appetite  Objective: Lab:no new lab Imaging:no new imaging  Physical Exam: Filed Vitals:   12/24/11 1005  BP: 138/78  Pulse: 71  Temp: 97.9 F (36.6 C)  Resp: 20  overweight white woman in no distress Pulm - normal respirations Cor - RRR Derm- legs with mild erythema, tender to touch    Assessment/Plan: 2. ID #6 unasyn. Plan - change to augmentin 875 mg bid  Dispo - spoke with family - they will make a selection today - for transfer to SNF Tuesday, March 12.   Illene Regulus 12/24/2011, 12:31 PM

## 2011-12-25 MED ORDER — AMOXICILLIN-POT CLAVULANATE 500-125 MG PO TABS: 1.0000 | ORAL_TABLET | Freq: Two times a day (BID) | ORAL | Status: AC

## 2011-12-25 MED ORDER — OXYCODONE-ACETAMINOPHEN 5-325 MG PO TABS: 2.0000 | ORAL_TABLET | ORAL | Status: AC | PRN

## 2011-12-25 MED ORDER — FAMOTIDINE 20 MG PO TABS: 20.0000 mg | ORAL_TABLET | Freq: Two times a day (BID) | ORAL | Status: DC

## 2011-12-25 NOTE — Progress Notes (Signed)
Pt to go to [facility] later today. Pt and family aware and agree. Pt to go via ambulance. CSW still waiting on d/c summary to be up.  Facility aware.  Vennie Homans, [place] 12/25/2011  12:31 PM

## 2011-12-25 NOTE — Discharge Summary (Signed)
Olivia, Duran                 ACCOUNT NO.:  [id number]  MEDICAL RECORD NO.:  [id number]  LOCATION:  1502                         FACILITY:  [facility]  PHYSICIAN:  Rosalyn Gess. Deyana Wnuk, MD  DATE OF BIRTH:  15-Sep-1925  DATE OF ADMISSION:  12/19/2011 DATE OF DISCHARGE:  12/25/2011                              DISCHARGE SUMMARY   Discharged or transferred to skilled care on December 25, 2011.  ADMITTING DIAGNOSES: 1. Syncope. 2. Cellulitis, distal bilateral lower extremities. 3. Mild congestive heart failure. 4. Gastroesophageal reflux disease. 5. Chronic anemia.  DISCHARGE DIAGNOSES: 1. Syncope with transient ischemic attack/cerebrovascular accident     ruled out. 2. Stasis dermatitis, improved, and on oral antibiotics. 3. Gastroesophageal reflux disease, stable. 4. Congestive heart failure, stable. 5. Anemia, stable.  CONSULTANTS:  None.  PROCEDURES: 1. Chest x-ray on day of admission with no acute cardiopulmonary     disease.  CT of the head on day of admission, which showed no acute     abnormalities, severe atrophy is noted. 2. MRI of the brain performed, March 7, with no acute findings.  No     intracranial hemorrhage.  No acute infarct.  She had a 9 mm left     parietal region meningioma without associated mass effect.  Mild     small-vessel disease.  Prominent global atrophy.  Ventricular     prominence may be related to atrophy versus mild component of     hydrocephalus.  Appearance is unchanged from previous study. 3. MRA, intracranial atherosclerotic type changes most notable     involving the posterior circulation noted.  No acute occlusions     noted. 4. A 2D echocardiogram performed, March 7, with an EF of 55% to 60%.     Wall motion was normal.  No regional wall motion abnormalities     noted.  Grade 1 diastolic dysfunction was diagnosed.  Carotid     Dopplers were performed, March 7, no significant extracranial     carotid artery stenosis is demonstrated.   Vertebrals are patent     with antegrade flow.  Lower extremity venous Dopplers were     performed, March 7, no evidence of DVT.  No evidence of Baker's     cyst.  HISTORY OF THE PRESENT ILLNESS:  Ms. [name] is an 78 year old woman with chronic venous insufficiency and stasis dermatitis.  She was recently seen in the emergency department for increased problem with severe knee pain due to degenerative joint disease.  She was able to return home. While sitting at the dinner table, she reported that she was not feeling well and slumped to her side.  She has complained of being sick on her stomach and having blurry vision for a few minutes and having some mild confusion, which did persist.  She had no true loss of consciousness. She had no postictal symptoms.  She had no loss of bowel or bladder control.  She had no visible tonic-clonic movements.  She was brought to the emergency department and was found to be hypotensive and that resolved with a fluid bolus.  She was admitted to rule out TIA versus syncope.  Please see the  H and P and other epic records for past medical history, family history, social history, and physical exam.  HOSPITAL COURSE: 1. Syncope.  The patient was monitored on telemetry and had no     significant arrhythmia.  She had a full workup for cerebrovascular     disease with no evidence of stroke on imaging studies, no evidence     of vascular occlusion on carotid Dopplers.  The patient returned to     her baseline in regards to the cognitive function.  She had no     sequelae to suggest any lasting central nervous system disorder. 2. Cellulitis, lower extremities.  The patient has chronic venous     stasis and developed significant worsening erythema, heat to the     touch and tenderness.  She was diagnosed with a bilateral lower     extremity cellulitis.  She was treated with Unasyn for a total of 6     days and then was switched over to oral Augmentin.  Her legs  are     improved, although venous stasis continues to be a problem.  There     were no open lesions. 3. GERD.  The patient had some mild heartburn indigestion and is     controlled with H2 blocker therapy. 4. CHF, question of mild fluid overload.  ProBNP was 917 on March 7.     The patient was gently diuresed and did well.  A 2D echo was     performed, which showed no significant cardiomyopathy.  This     problem was considered stable. 5. Anemia.  The patient did have iron studies, which showed total iron     to be 41 just below normal.  TIBC was normal at 258.  Iron percent     saturation was 16%, slightly low.  Ferritin was 51 and normal.  B12     was 653 and in normal range.  Hemoglobin was stable at 10.6 g, and     going back to May 2012 she had a hemoglobin of 9.5 giving a     probable diagnosis of anemia of chronic disease.  No further     intervention or workup was required.  The patient did have a     reticulocyte count, which was normal.  The patient did have a     hemoglobin A1c on this admission that was 5.8%. 6. Weakness and mobility issues.  The patient was seen by PT and OT.     She does have significant limitation in her ability to ambulate due     to chronic venous stasis and weakness in her legs as well as severe     degenerative joint disease of both knees.  It was recommended that     the patient would benefit from skilled care for PT, OT, and rehab.     The patient is in agreement with this and facility has been located     for her. 7. With the patient's evaluation being completed for syncope, with her     other medical problems being stable, with her infection being well     controlled now on oral antibiotics, she is ready for transfer to     skilled care.  At facility, she may have PT and OT.  She can     participate in all facility activities and may follow all facility     protocols.  DISCHARGE EXAMINATION:  VITAL SIGNS:  Temperature was 98,  blood  pressure 146/76, pulse 62, respirations 18.  O2 sat is 95%. GENERAL APPEARANCE:  This is an overweight elderly Caucasian woman in no acute distress. HEENT EXAM:  Normocephalic, atraumatic.  Conjunctivae and sclerae was clear. NECK:  Supple.  No thyromegaly was appreciated. CHEST:  Without deformity. PULMONARY:  The patient has normal respirations with no increased work of breathing.  No rales, no wheezes, no rhonchi. CARDIOVASCULAR:  A 2+ radial pulse.  Her precordium was quiet.  She had a regular rate and rhythm. BREASTS EXAM:  Deferred. ABDOMEN:  Soft with positive bowel sounds in all 4 quadrants. GENITALIA:  Normal external genitalia. EXTREMITIES:  The patient has significant obesity.  She has no valgus or varus deformity of the knees. DERM:  The patient with significant peripheral edema, thought secondary to venous insufficiency with stasis dermatitis type changes.  There also was some mild residual erythema, mild tenderness. NEURO:  The patient is awake, alert.  She is oriented to person, place, context, and examiner.  Speech is clear.  She seems aware of her surroundings and understands her situation.  Cranial nerves II through XII are grossly intact with normal facial symmetry.  Extraocular muscles were intact.  Pupils were round and equal.  The patient is able to move all extremities to command.  FINAL LABORATORY:  Final chemistries from March 6 with a sodium of 132, potassium 4.5, chloride 95, CO2 of 27, BUN of 42, creatinine 1.52, mean plasma glucose 120, serum glucose 155.  Liver functions were normal. Total bilirubin was slightly low at 0.2.  Cardiac enzymes were cycled and were negative x3.  Lipid panel with a cholesterol of 195, triglycerides 143, HDL of 63, LDL was 103.  Iron studies as noted.  DISPOSITION:  The patient is ready for transfer to skilled care for physical therapy and rehabilitation to try to regain increased mobility. She will continue on all of her  home medications as listed.  She will continue on Augmentin 875 mg b.i.d. for an additional 5 days.  As noted above, the patient may participate in all nursing facility protocols.  The patient will be seen in the office within 7 days of discharge from skilled care.  Thank you very much for your assistance.     Rosalyn Gess Sharmel Ballantine, MD     MEN/MEDQ  D:  12/25/2011  T:  12/25/2011  Job:  [id number]

## 2011-12-25 NOTE — Progress Notes (Signed)
D/c summary available.  Notified Janie at [employer] [employer] that CSW is faxing d/c summary.  Per Wille Celeste, OK to send Pt after faxing d/c summary.  Faxed d/c summary.  Notified Care Coordinator, Aram Beecham, that facility ready to receive Pt.  Arranged for transportation.  Pt to be d/c'd.  Providence Crosby, LCSWA Clinical Social Work [phone]

## 2012-02-05 NOTE — ED Provider Notes (Signed)
Medical screening examination/treatment/procedure(s) were conducted as a shared visit with non-physician practitioner(s) and myself.  I personally evaluated the patient during the encounter   Glynn Octave, MD 02/05/12 1218

## 2012-06-07 ENCOUNTER — Inpatient Hospital Stay (HOSPITAL_COMMUNITY): Payer: Medicare Other

## 2012-06-07 ENCOUNTER — Emergency Department (HOSPITAL_COMMUNITY): Payer: Medicare Other

## 2012-06-07 ENCOUNTER — Encounter (HOSPITAL_COMMUNITY): Payer: Self-pay | Admitting: Emergency Medicine

## 2012-06-07 ENCOUNTER — Inpatient Hospital Stay (HOSPITAL_COMMUNITY)
Admission: EM | Admit: 2012-06-07 | Discharge: 2012-06-11 | DRG: 948 | Disposition: A | Discharge status: Home / Self Care | Payer: Medicare Other | Attending: Internal Medicine | Admitting: Internal Medicine

## 2012-06-07 DIAGNOSIS — R609 Edema, unspecified: Secondary | ICD-10-CM | POA: Diagnosis present

## 2012-06-07 DIAGNOSIS — H109 Unspecified conjunctivitis: Secondary | ICD-10-CM | POA: Diagnosis present

## 2012-06-07 DIAGNOSIS — E860 Dehydration: Secondary | ICD-10-CM | POA: Diagnosis present

## 2012-06-07 DIAGNOSIS — L03115 Cellulitis of right lower limb: Secondary | ICD-10-CM

## 2012-06-07 DIAGNOSIS — N183 Chronic kidney disease, stage 3 unspecified: Secondary | ICD-10-CM | POA: Diagnosis present

## 2012-06-07 DIAGNOSIS — E785 Hyperlipidemia, unspecified: Secondary | ICD-10-CM | POA: Diagnosis present

## 2012-06-07 DIAGNOSIS — I129 Hypertensive chronic kidney disease with stage 1 through stage 4 chronic kidney disease, or unspecified chronic kidney disease: Secondary | ICD-10-CM | POA: Diagnosis present

## 2012-06-07 DIAGNOSIS — H1030 Unspecified acute conjunctivitis, unspecified eye: Secondary | ICD-10-CM

## 2012-06-07 DIAGNOSIS — L03116 Cellulitis of left lower limb: Secondary | ICD-10-CM

## 2012-06-07 DIAGNOSIS — M25529 Pain in unspecified elbow: Secondary | ICD-10-CM | POA: Diagnosis present

## 2012-06-07 DIAGNOSIS — N179 Acute kidney failure, unspecified: Secondary | ICD-10-CM

## 2012-06-07 DIAGNOSIS — R4182 Altered mental status, unspecified: Principal | ICD-10-CM

## 2012-06-07 DIAGNOSIS — R600 Localized edema: Secondary | ICD-10-CM

## 2012-06-07 DIAGNOSIS — L0390 Cellulitis, unspecified: Secondary | ICD-10-CM

## 2012-06-07 DIAGNOSIS — I10 Essential (primary) hypertension: Secondary | ICD-10-CM

## 2012-06-07 DIAGNOSIS — M1710 Unilateral primary osteoarthritis, unspecified knee: Secondary | ICD-10-CM | POA: Diagnosis present

## 2012-06-07 DIAGNOSIS — H906 Mixed conductive and sensorineural hearing loss, bilateral: Secondary | ICD-10-CM

## 2012-06-07 LAB — URINALYSIS, ROUTINE W REFLEX MICROSCOPIC
Glucose, UA: NEGATIVE mg/dL
Hgb urine dipstick: NEGATIVE
Ketones, ur: NEGATIVE mg/dL
Nitrite: NEGATIVE
Protein, ur: NEGATIVE mg/dL
Specific Gravity, Urine: 1.013 (ref 1.005–1.030)
Urobilinogen, UA: 0.2 mg/dL (ref 0.0–1.0)
pH: 6.5 (ref 5.0–8.0)

## 2012-06-07 LAB — BASIC METABOLIC PANEL
Chloride: 92 mEq/L — ABNORMAL LOW (ref 96–112)
Creatinine, Ser: 1.63 mg/dL — ABNORMAL HIGH (ref 0.50–1.10)
GFR calc Af Amer: 32 mL/min — ABNORMAL LOW (ref >90)
Potassium: 4.2 mEq/L (ref 3.5–5.1)
Sodium: 131 mEq/L — ABNORMAL LOW (ref 135–145)

## 2012-06-07 LAB — CBC WITH DIFFERENTIAL/PLATELET
Basophils Absolute: 0 10*3/uL (ref 0.0–0.1)
Basophils Relative: 0 % (ref 0–1)
Eosinophils Absolute: 0.3 K/uL (ref 0.0–0.7)
Eosinophils Relative: 4 % (ref 0–5)
HCT: 29.2 % — ABNORMAL LOW (ref 36.0–46.0)
Hemoglobin: 10 g/dL — ABNORMAL LOW (ref 12.0–15.0)
Lymphocytes Relative: 23 % (ref 12–46)
Lymphs Abs: 1.7 K/uL (ref 0.7–4.0)
MCH: 32.9 pg (ref 26.0–34.0)
MCHC: 34.2 g/dL (ref 30.0–36.0)
MCV: 96.1 fL (ref 78.0–100.0)
Monocytes Absolute: 0.9 K/uL (ref 0.1–1.0)
Monocytes Relative: 12 % (ref 3–12)
Neutro Abs: 4.3 10*3/uL (ref 1.7–7.7)
Neutrophils Relative %: 60 % (ref 43–77)
Platelets: 357 10*3/uL (ref 150–400)
RBC: 3.04 MIL/uL — ABNORMAL LOW (ref 3.87–5.11)
RDW: 12.3 % (ref 11.5–15.5)
WBC: 7.1 K/uL (ref 4.0–10.5)

## 2012-06-07 LAB — URINE MICROSCOPIC-ADD ON

## 2012-06-07 LAB — BASIC METABOLIC PANEL WITH GFR
BUN: 47 mg/dL — ABNORMAL HIGH (ref 6–23)
CO2: 28 meq/L (ref 19–32)
Calcium: 9.6 mg/dL (ref 8.4–10.5)
GFR calc non Af Amer: 28 mL/min — ABNORMAL LOW (ref >90)
Glucose, Bld: 99 mg/dL (ref 70–99)

## 2012-06-07 LAB — GLUCOSE, CAPILLARY: Glucose-Capillary: 96 mg/dL (ref 70–99)

## 2012-06-07 MED ORDER — CLINDAMYCIN PHOSPHATE 600 MG/50ML IV SOLN
600.0000 mg | Freq: Once | INTRAVENOUS | Status: AC
  Administered 2012-06-07: 600 mg via INTRAVENOUS
  Filled 2012-06-07: qty 50

## 2012-06-07 MED ORDER — ONDANSETRON HCL 4 MG/2ML IJ SOLN: 4.0000 mg | Freq: Three times a day (TID) | INTRAMUSCULAR | Status: AC | PRN

## 2012-06-07 MED ORDER — METOPROLOL SUCCINATE ER 100 MG PO TB24
100.0000 mg | ORAL_TABLET | Freq: Every day | ORAL | Status: DC
  Administered 2012-06-07 – 2012-06-11 (×5): 100 mg via ORAL
  Filled 2012-06-07 (×5): qty 1

## 2012-06-07 MED ORDER — SODIUM CHLORIDE 0.9 % IV SOLN: INTRAVENOUS | Status: DC

## 2012-06-07 MED ORDER — ENALAPRIL MALEATE 20 MG PO TABS
20.0000 mg | ORAL_TABLET | Freq: Every day | ORAL | Status: DC
  Administered 2012-06-07 – 2012-06-11 (×5): 20 mg via ORAL
  Filled 2012-06-07 (×5): qty 1

## 2012-06-07 MED ORDER — ACETAMINOPHEN 325 MG PO TABS: 650.0000 mg | ORAL_TABLET | ORAL | Status: DC | PRN

## 2012-06-07 MED ORDER — FUROSEMIDE 80 MG PO TABS
80.0000 mg | ORAL_TABLET | Freq: Every day | ORAL | Status: DC
  Administered 2012-06-07 – 2012-06-08 (×2): 80 mg via ORAL
  Filled 2012-06-07 (×2): qty 1

## 2012-06-07 MED ORDER — OXYCODONE-ACETAMINOPHEN 5-325 MG PO TABS: 1.0000 | ORAL_TABLET | Freq: Four times a day (QID) | ORAL | Status: DC | PRN

## 2012-06-07 MED ORDER — ACETAMINOPHEN 325 MG PO TABS: 650.0000 mg | ORAL_TABLET | Freq: Four times a day (QID) | ORAL | Status: DC | PRN

## 2012-06-07 MED ORDER — FAMOTIDINE 20 MG PO TABS
20.0000 mg | ORAL_TABLET | Freq: Two times a day (BID) | ORAL | Status: DC
  Administered 2012-06-07 – 2012-06-11 (×8): 20 mg via ORAL
  Filled 2012-06-07 (×9): qty 1

## 2012-06-07 MED ORDER — CALCIUM CARBONATE ANTACID 500 MG PO CHEW
2.0000 | CHEWABLE_TABLET | Freq: Every day | ORAL | Status: DC
  Administered 2012-06-07 – 2012-06-11 (×5): 400 mg via ORAL
  Filled 2012-06-07 (×5): qty 2

## 2012-06-07 MED ORDER — VITAMIN D3 25 MCG (1000 UNIT) PO TABS
1000.0000 [IU] | ORAL_TABLET | Freq: Every day | ORAL | Status: DC
  Administered 2012-06-07 – 2012-06-11 (×5): 1000 [IU] via ORAL
  Filled 2012-06-07 (×5): qty 1

## 2012-06-07 MED ORDER — CEFAZOLIN SODIUM 1-5 GM-% IV SOLN
1.0000 g | Freq: Three times a day (TID) | INTRAVENOUS | Status: DC
  Administered 2012-06-07 – 2012-06-10 (×8): 1 g via INTRAVENOUS
  Filled 2012-06-07 (×10): qty 50

## 2012-06-07 NOTE — ED Notes (Signed)
Confusion, agitation and refusing meds (change in behavior). U/A collected on 05/26/12.  Daughter requesting rule out UTI.

## 2012-06-07 NOTE — H&P (Addendum)
[facility] History and Physical  [name] YQM:[id number] DOB: 04/02/1925 DOA: 06/07/2012  Referring physician: Dr Oletta Lamas PCP: "[facility]"   Chief Complaint: brought for increasing confusion  HPI: [name] is a 78 y.o. female  This is an 78 y/o female who is brought to the hospital due to recent mental status changes. Per patient's daughter who is present to give the history, she has noticed the patient getting more confused in the evening hours with symptoms mainly consistent of fear of something being wrong whit her and fear of dying. She has noticed this over the past week. She has also noted that her already edematous extremities turned red today- she has noticed that they occasionally become pink and this resolves on its own. She has not noted this type of redness or warmth in the past. Due to increasing confusion, a UA was performed at the nursing home and found to be negative. The and ER doctor and daughter are concerned that she has developed a b/l cellulitis. Pt does not c/o pain in the legs except when she has to stand and in that case the pain has been in the left leg- she recently received 2 Simvisc injections in the left knee for arthritis.   Review of Systems: No c/o fevers, chills, weight loss, chest pain, dyspnea, sore throat, cough, dysuria, vomiting, diarrhea, back pain, focal weakness or numbness or numbness.  She has been complaining of pain in her right elbow for about a week.    Past Medical History  Diagnosis Date  . Arthritis   . Hypertension   . Hyperlipidemia   . CHF (congestive heart failure)- diastolic grade 1 CHF    Past Surgical History  Procedure Date  . Joint replacement    Social History:  reports that she has never smoked. She has never used smokeless tobacco. She reports that she does not drink alcohol or use illicit drugs. Resides in SNF?  Unable to ambulated due to pain in legs  No Known Allergies  History reviewed.  No pertinent family history.  HTN  Prior to Admission medications   Medication Sig Start Date End Date Taking? Authorizing Provider  acetaminophen (TYLENOL) 325 MG tablet Take 650 mg by mouth every 6 (six) hours as needed. For pain   Yes Historical Provider, MD  calcium carbonate (TUMS - DOSED IN MG ELEMENTAL CALCIUM) 500 MG chewable tablet Chew 2 tablets by mouth daily.   Yes Historical Provider, MD  cholecalciferol (VITAMIN D) 1000 UNITS tablet Take 1,000 Units by mouth daily.   Yes Historical Provider, MD  enalapril (VASOTEC) 20 MG tablet  12/21/11  Yes Jacques Navy, MD  etodolac (LODINE XL) 400 MG 24 hr tablet Take 400 mg by mouth 2 (two) times daily.   Yes Historical Provider, MD  famotidine (PEPCID) 20 MG tablet Take 20 mg by mouth 2 (two) times daily. 12/25/11 12/24/12 Yes Jacques Navy, MD  furosemide (LASIX) 80 MG tablet Take 80 mg by mouth 2 (two) times daily.   Yes Historical Provider, MD  LORazepam (ATIVAN) 0.5 MG tablet Take 0.5 mg by mouth every 6 (six) hours as needed. Agitation   Yes Historical Provider, MD  Menthol, Topical Analgesic, (BIOFREEZE EX) Apply 4 application topically daily as needed. Pain   Yes Historical Provider, MD  metoprolol succinate (TOPROL-XL) 100 MG 24 hr tablet Take 100 mg by mouth daily. Take with or immediately following a meal.   Yes Historical Provider, MD  oxyCODONE-acetaminophen (  PERCOCET/ROXICET) 5-325 MG per tablet Take 1 tablet by mouth every 4 (four) hours as needed. Pain   Yes Historical Provider, MD  metoprolol (TOPROL XL) 100 MG 24 hr tablet Take 1 tablet (100 mg total) by mouth daily. 01/25/11 01/25/12  Jacques Navy, MD   Physical Exam: Filed Vitals:   06/07/12 1111 06/07/12 1132 06/07/12 1445 06/07/12 1530  BP: 148/106 151/86    Pulse: 75 76  82  Temp: 98.2 F (36.8 C)     TempSrc: Oral     Resp: 17 15 19 15   SpO2: 100% 99%  98%     General:  Elderly female, alert and calm, laying in bed in no distress.   Eyes: conjunctiva  pink, PERRL  Mouth:  oral mucosa dry  Neck: no lymphadenopathy or JVD  Cardiovascular: RRR, no murmurs  Respiratory: CTA b/l  Abdomen: soft, NT, ND, BS+  Skin: b.l lower extremity warm and erythema with hyperkeratosis of skin consistent with chronic lymphedema- currently no weeping is noted  Musculoskeletal: unable to fully extend her right elbow- no swelling- pt point to a "painful" spot that is near the head of the radius. No tenderness noted on exam.   Psychiatric: alert, oriented x 3, calm  Neurologic: CN 2-12 intact, no other focal deficits noted  Labs on Admission:  Basic Metabolic Panel:  Lab 06/07/12 1610  NA 131*  K 4.2  CL 92*  CO2 28  GLUCOSE 99  BUN 47*  CREATININE 1.63*  CALCIUM 9.6  MG --  PHOS --   Liver Function Tests: No results found for this basename: AST:5,ALT:5,ALKPHOS:5,BILITOT:5,PROT:5,ALBUMIN:5 in the last 168 hours No results found for this basename: LIPASE:5,AMYLASE:5 in the last 168 hours No results found for this basename: AMMONIA:5 in the last 168 hours CBC:  Lab 06/07/12 1148  WBC 7.1  NEUTROABS 4.3  HGB 10.0*  HCT 29.2*  MCV 96.1  PLT 357   Cardiac Enzymes: No results found for this basename: CKTOTAL:5,CKMB:5,CKMBINDEX:5,TROPONINI:5 in the last 168 hours  BNP (last 3 results)  Basename 12/20/11 0340  PROBNP 917.2*   CBG:  Lab 06/07/12 1134  GLUCAP 96    Radiological Exams on Admission: Ct Head Wo Contrast  06/07/2012  *RADIOLOGY REPORT*  Clinical Data: Altered mental status.  CT HEAD WITHOUT CONTRAST  Technique:  Contiguous axial images were obtained from the base of the skull through the vertex without contrast.  Comparison: 12/19/2011  Findings: There is diffuse cerebral atrophy which is unchanged. Again noted is a diffuse enlargement of the ventricles which appears stable.  No evidence for acute hemorrhage, mass lesion, midline shift, hydrocephalus or large infarct.  There is a small infarct or insult involving the  right caudate body.  There is low density in the periventricular white matter suggesting chronic changes.  No acute bony abnormality.  IMPRESSION: No acute intracranial abnormality.  Age indeterminate small lacunar infarct involving the right caudate body.  Diffuse atrophy and evidence for chronic small vessel ischemic changes.   Original Report Authenticated By: Richarda Overlie, M.D.    Dg Chest Port 1 View  06/07/2012  *RADIOLOGY REPORT*  Clinical Data: Altered mental status.  PORTABLE CHEST - 1 VIEW  Comparison: 12/19/2011  Findings: The cardiomediastinal silhouette is unremarkable. The lungs are clear. There is no evidence of focal airspace disease, pulmonary edema, suspicious pulmonary nodule/mass, pleural effusion, or pneumothorax. No acute bony abnormalities are identified.  IMPRESSION: No evidence of active cardiopulmonary disease.   Original Report Authenticated By: JEFFREY T. HU,  M.D.     EKG: Independently reviewed. - NSR, QTc 532 ms, first degree AV block with PR interval of 324  Assessment/Plan Active Problems:   Mental status changes I have a feeling that this is more sundowning than anything else and her paranoia is either related to early dementia or depression. We have to note that she has changed living facilities 3 tims since March- she went from home to the hospital to [facility] to an assisted living in [place] and then an assisted living facility here in [place] and this may be impacting on her confusion as well.  Since the head CT reveals a possible infarct, I will request an MRI- still not certain if a CVA would be causing these kind of mental status changes.   Lower extremity edema/ erythema possibility of cellulitis- however it is strange that this would be bilateral. I would suspect that much of this is coming from her severe edema but to err on the side of caution, will start antibiotics. As her QTc is prolongs, would be hesitant to start Quinolones- will place on Ancef  for now.  - continue lasix at 80 mg daily for now to attempt to decrease swelling- she was previously only on 40 mg daily and therefore we will need to watch carefully for further dehydration.  - wrap legs with ACE wraps - elevate feet  Dehydration/ ARF on CRF Unfortunately this is likely from Lasix which we are unable to d/c currently (see above). Follow carefully  Right elbow pain Due to the fact that she has hardware and is unable to extend the arm completely I feel that a Xray is warranted- I have ordered this.   Code Status: family would like to further discuss this- Full code for now.  Family Communication: daughter Disposition Plan: admit to med/surg  Time spent: 72 min  [facility] Pager [phone]  If 7PM-7AM, please contact night-coverage [email or web address] Password [facility] 06/07/2012, 4:03 PM

## 2012-06-07 NOTE — ED Provider Notes (Signed)
History     CSN: [id number]  Arrival date & time 06/07/12  1100   First MD Initiated Contact with Patient 06/07/12 1128      Chief Complaint  Patient presents with  . Altered Mental Status    (Consider location/radiation/quality/duration/timing/severity/associated sxs/prior treatment) HPI Comments: Level 5 caveat due to dementia and altered mentation.  Pt lives in assisted living, PCP is now [facility] and sees a PA, has lived there for 6 weeks, has had a subtle and now more noticeable change in mentation and behavior over 1-2 weeks.  Pt has had UTI in the past, was checked for it yesterday and looked ok so PA called family and urged she be brought to the ED.  Pt deneis HA, CP, abd pain, SOB, cough.  Pt also has had peripheral edema problems and so has had lasix increased, also has had lower ext cellulitis in the past, both lower legs have turned red and pt has some pain  In legs with certain movements.  Pt has apparently refused meds recently.    Patient is a 78 y.o. female presenting with altered mental status. The history is provided by the patient, the nursing home, medical records and a relative.  Altered Mental Status    Past Medical History  Diagnosis Date  . Arthritis   . Hypertension   . Hyperlipidemia   . CHF (congestive heart failure)     Past Surgical History  Procedure Date  . Joint replacement     History reviewed. No pertinent family history.  History  Substance Use Topics  . Smoking status: Never Smoker   . Smokeless tobacco: Never Used  . Alcohol Use: No    OB History    Grav Para Term Preterm Abortions TAB SAB Ect Mult Living                  Review of Systems  Unable to perform ROS: Mental status change  Psychiatric/Behavioral: Positive for altered mental status.    Allergies  Review of patient's allergies indicates no known allergies.  Home Medications   Current Outpatient Rx  Name Route Sig Dispense Refill  .  ACETAMINOPHEN 325 MG PO TABS Oral Take 650 mg by mouth every 6 (six) hours as needed. For pain    . CALCIUM CARBONATE ANTACID 500 MG PO CHEW Oral Chew 2 tablets by mouth daily.    Marland Kitchen VITAMIN D 1000 UNITS PO TABS Oral Take 1,000 Units by mouth daily.    . ENALAPRIL MALEATE 20 MG PO TABS      . ETODOLAC ER 400 MG PO TB24 Oral Take 400 mg by mouth 2 (two) times daily.    Marland Kitchen FAMOTIDINE 20 MG PO TABS Oral Take 20 mg by mouth 2 (two) times daily.    . FUROSEMIDE 80 MG PO TABS Oral Take 80 mg by mouth 2 (two) times daily.    Marland Kitchen LORAZEPAM 0.5 MG PO TABS Oral Take 0.5 mg by mouth every 6 (six) hours as needed. Agitation    . BIOFREEZE EX Apply externally Apply 4 application topically daily as needed. Pain    . METOPROLOL SUCCINATE ER 100 MG PO TB24 Oral Take 100 mg by mouth daily. Take with or immediately following a meal.    . OXYCODONE-ACETAMINOPHEN 5-325 MG PO TABS Oral Take 1 tablet by mouth every 4 (four) hours as needed. Pain    . METOPROLOL SUCCINATE ER 100 MG PO TB24 Oral Take 1 tablet (100 mg total)  by mouth daily. 30 tablet 11    BP 151/86  Pulse 76  Temp 98.2 F (36.8 C) (Oral)  Resp 15  SpO2 99%  Physical Exam  Vitals reviewed. Constitutional: She appears well-developed and well-nourished.  Non-toxic appearance. She does not have a sickly appearance. No distress.  HENT:  Head: Normocephalic and atraumatic.  Eyes: Pupils are equal, round, and reactive to light. No scleral icterus.  Neck: Normal range of motion. Neck supple.  Cardiovascular: Normal rate.   Pulmonary/Chest: Effort normal. No respiratory distress. She has no wheezes. She has no rales.  Abdominal: Soft. She exhibits no distension. There is no tenderness.  Musculoskeletal: She exhibits edema.       Right lower leg: She exhibits tenderness, swelling and edema. She exhibits no bony tenderness, no deformity and no laceration.       Left lower leg: She exhibits tenderness, swelling and edema. She exhibits no bony tenderness,  no deformity and no laceration.       Skin around B lower extremities are erythematous, warm to touch, mildly tender  Neurological: She is alert. She has normal strength. She displays no tremor. No cranial nerve deficit or sensory deficit. She exhibits normal muscle tone. GCS eye subscore is 4. GCS verbal subscore is 4. GCS motor subscore is 6.  Skin: Skin is warm.  Psychiatric: Her mood appears not anxious. Her speech is not rapid and/or pressured. She is not agitated. She does not exhibit a depressed mood. She exhibits abnormal recent memory and abnormal remote memory.    ED Course  Procedures (including critical care time)  Labs Reviewed  CBC WITH DIFFERENTIAL - Abnormal; Notable for the following:    RBC 3.04 (*)     Hemoglobin 10.0 (*)     HCT 29.2 (*)     All other components within normal limits  BASIC METABOLIC PANEL - Abnormal; Notable for the following:    Sodium 131 (*)     Chloride 92 (*)     BUN 47 (*)     Creatinine, Ser 1.63 (*)     GFR calc non Af Amer 28 (*)     GFR calc Af Amer 32 (*)     All other components within normal limits  URINALYSIS, ROUTINE W REFLEX MICROSCOPIC - Abnormal; Notable for the following:    APPearance CLOUDY (*)     Bilirubin Urine MODERATE (*)     Leukocytes, UA TRACE (*)     All other components within normal limits  GLUCOSE, CAPILLARY  URINE MICROSCOPIC-ADD ON  URINE CULTURE   Ct Head Wo Contrast  06/07/2012  *RADIOLOGY REPORT*  Clinical Data: Altered mental status.  CT HEAD WITHOUT CONTRAST  Technique:  Contiguous axial images were obtained from the base of the skull through the vertex without contrast.  Comparison: 12/19/2011  Findings: There is diffuse cerebral atrophy which is unchanged. Again noted is a diffuse enlargement of the ventricles which appears stable.  No evidence for acute hemorrhage, mass lesion, midline shift, hydrocephalus or large infarct.  There is a small infarct or insult involving the right caudate body.  There is  low density in the periventricular white matter suggesting chronic changes.  No acute bony abnormality.  IMPRESSION: No acute intracranial abnormality.  Age indeterminate small lacunar infarct involving the right caudate body.  Diffuse atrophy and evidence for chronic small vessel ischemic changes.   Original Report Authenticated By: Richarda Overlie, M.D.    Dg Chest Port 1 View  06/07/2012  *  RADIOLOGY REPORT*  Clinical Data: Altered mental status.  PORTABLE CHEST - 1 VIEW  Comparison: 12/19/2011  Findings: The cardiomediastinal silhouette is unremarkable. The lungs are clear. There is no evidence of focal airspace disease, pulmonary edema, suspicious pulmonary nodule/mass, pleural effusion, or pneumothorax. No acute bony abnormalities are identified.  IMPRESSION: No evidence of active cardiopulmonary disease.   Original Report Authenticated By: Rosendo Gros, M.D.      1. Cellulitis of left lower extremity   2. Cellulitis of right lower extremity   3. Altered mental status     RA sat is 99% and I interpret to be normal.    ECG at time 11:32 shows NSR.  LAD, nonspecific QRS widening.  compared to ECG from 12/19/11, non specific inf T wave abn no longer seen.  LAD is new.    2:30 PM Spoke to [facility] who will see pt in the ED and admit to regular floor.   MDM  I reviewed prior records.  Also UA from yesterday is with pt.  Neg.  Will check labs, UA and otherwise I suspect B cellulitis may be cause for AMS.          Gavin Pound. Deslyn Cavenaugh, MD 06/07/12 1430

## 2012-06-07 NOTE — ED Notes (Signed)
WUJ:WJ19<JY> Expected date:06/07/12<BR> Expected time:10:46 AM<BR> Means of arrival:Ambulance<BR> Comments:<BR> FTT elderly

## 2012-06-08 DIAGNOSIS — M79609 Pain in unspecified limb: Secondary | ICD-10-CM

## 2012-06-08 DIAGNOSIS — M7989 Other specified soft tissue disorders: Secondary | ICD-10-CM

## 2012-06-08 LAB — URINE CULTURE: Colony Count: >=100000

## 2012-06-08 LAB — BASIC METABOLIC PANEL
GFR calc Af Amer: 26 mL/min — ABNORMAL LOW (ref >90)
GFR calc non Af Amer: 23 mL/min — ABNORMAL LOW (ref >90)
Potassium: 4 mEq/L (ref 3.5–5.1)
Sodium: 134 mEq/L — ABNORMAL LOW (ref 135–145)

## 2012-06-08 MED ORDER — FUROSEMIDE 40 MG PO TABS
40.0000 mg | ORAL_TABLET | Freq: Every day | ORAL | Status: DC
  Administered 2012-06-09 – 2012-06-11 (×3): 40 mg via ORAL
  Filled 2012-06-08 (×3): qty 1

## 2012-06-08 NOTE — Progress Notes (Addendum)
[facility] PROGRESS NOTE  [name] GMW:[id number] DOB: 04/21/1925 DOA: 06/07/2012 PCP: Illene Regulus, MD  Assessment/Plan: Mental status changes  I have a feeling that this is more sundowning than anything else and her paranoia is either related to early dementia or depression. We have to note that she has changed living facilities 3 times since March- she went from home to the hospital to [facility] to an assisted living in [place] and then an assisted living facility here in [place] and this may be impacting on her confusion as well.  Since the head CT reveals a possible infarct, I am considering an MRI but I am not certain if a CVA would be causing these kind of mental status changes. At this point, unless she develops worsening mental status, will hold off on MRI. She appears quite alert and oriented during the day as she was today- she is quite hard of hearing which could also result in some confusion for her.   Lower extremity edema/ erythema  possibility of cellulitis- however it is strange that this would be bilateral. I would suspect that much of this is coming from her severe edema but to err on the side of caution, have started antibiotics. As her QTc is prolonged, I am hesitant to start Quinolones- cont on Ancef for now.  - Will need to cut back Lasix to 40 mg daily as renal function is worsening.  - continue to wrap legs with ACE wraps  - elevate feet - not properly elevated today.   Dehydration/ ARF on CRF  Unfortunately this is likely from Lasix - adjusting dosage back to 40 daily.   Right elbow pain  Due to the fact that she has hardware and is unable to extend the arm completely I have obtained an Xray which does not reveal any changes    Code Status: Full Code Family Communication: with son at bedside Disposition Plan: return to nursing facility when stable.    Brief narrative: This is an 78 y/o female who is brought to the hospital due to recent  mental status changes. Per patient's daughter who is present to give the history, she has noticed the patient getting more confused in the evening hours with symptoms mainly consistent of fear of something being wrong whit her and fear of dying. She has noticed this over the past week. She has also noted that her already edematous extremities turned red today- she has noticed that they occasionally become pink and this resolves on its own. She has not noted this type of redness or warmth in the past. Due to increasing confusion, a UA was performed at the nursing home and found to be negative. The and ER doctor and daughter are concerned that she has developed a b/l cellulitis. Pt does not c/o pain in the legs except when she has to stand and in that case the pain has been in the left leg- she recently received 2 Simvisc injections in the left knee for arthritis.   Antibiotics: Ancef 8/24  HPI/Subjective: Pt alert today, no complaints. Son at bedside - he has been updated on the test results and the plan. All questions answered.   Objective: Filed Vitals:   06/07/12 1741 06/07/12 2200 06/08/12 0600 06/08/12 1405  BP:  105/61 112/66 137/71  Pulse: 79 67 64 64  Temp: 98 F (36.7 C) 98.2 F (36.8 C) 98.4 F (36.9 C) 97.9 F (36.6 C)  TempSrc: Oral Oral Oral Oral  Resp: 18 16 16  16  Height: 5' 5.5" (1.664 m)     Weight: 80.423 kg (177 lb 4.8 oz)     SpO2: 100% 97% 97% 96%    Intake/Output Summary (Last 24 hours) at 06/08/12 1748 Last data filed at 06/08/12 0600  Gross per 24 hour  Intake      0 ml  Output   1200 ml  Net  -1200 ml    Exam: General: Elderly female, alert and calm, laying in bed in no distress.  Eyes: conjunctiva pink, PERRL  Mouth: oral mucosa dry  Neck: no lymphadenopathy or JVD  Cardiovascular: RRR, no murmurs  Respiratory: CTA b/l  Abdomen: soft, NT, ND, BS+  Skin: b.l lower extremity warm and erythema with hyperkeratosis of skin consistent with chronic  lymphedema- currently no weeping is noted  Musculoskeletal: unable to fully extend her right elbow- no swelling- pt point to a "painful" spot that is near the head of the radius. No tenderness noted on exam.  Psychiatric: alert, oriented x 3, calm  Neurologic: CN 2-12 intact, no other focal deficits noted Data Reviewed: Basic Metabolic Panel:  Lab 06/08/12 4098 06/07/12 1148  NA 134* 131*  K 4.0 4.2  CL 94* 92*  CO2 32 28  GLUCOSE 137* 99  BUN 44* 47*  CREATININE 1.91* 1.63*  CALCIUM 9.1 9.6  MG -- --  PHOS -- --   Liver Function Tests: No results found for this basename: AST:5,ALT:5,ALKPHOS:5,BILITOT:5,PROT:5,ALBUMIN:5 in the last 168 hours No results found for this basename: LIPASE:5,AMYLASE:5 in the last 168 hours No results found for this basename: AMMONIA:5 in the last 168 hours CBC:  Lab 06/07/12 1148  WBC 7.1  NEUTROABS 4.3  HGB 10.0*  HCT 29.2*  MCV 96.1  PLT 357   Cardiac Enzymes: No results found for this basename: CKTOTAL:5,CKMB:5,CKMBINDEX:5,TROPONINI:5 in the last 168 hours BNP (last 3 results)  Basename 12/20/11 0340  PROBNP 917.2*   CBG:  Lab 06/07/12 1134  GLUCAP 96    No results found for this or any previous visit (from the past 240 hour(s)).   Studies: Dg Elbow Complete Right  06/07/2012  *RADIOLOGY REPORT*  Clinical Data: Right elbow pain  RIGHT ELBOW - COMPLETE 3+ VIEW  Comparison: None.  Findings: No definite acute fracture is seen.  Marked degenerative changes of the elbow joint.  Suspected prior resection of the proximal radius/radial head. Possible prosthesis/bone cement at the radiohumeral articulation.  Small joint effusion.  No soft tissue swelling overlying the olecranon.  IMPRESSION: No definite acute fracture is seen.  Suspected postsurgical and chronic degenerative changes of the elbow, as described above.  Small elbow joint effusion.   Original Report Authenticated By: Charline Bills, M.D.    Ct Head Wo Contrast  06/07/2012   *RADIOLOGY REPORT*  Clinical Data: Altered mental status.  CT HEAD WITHOUT CONTRAST  Technique:  Contiguous axial images were obtained from the base of the skull through the vertex without contrast.  Comparison: 12/19/2011  Findings: There is diffuse cerebral atrophy which is unchanged. Again noted is a diffuse enlargement of the ventricles which appears stable.  No evidence for acute hemorrhage, mass lesion, midline shift, hydrocephalus or large infarct.  There is a small infarct or insult involving the right caudate body.  There is low density in the periventricular white matter suggesting chronic changes.  No acute bony abnormality.  IMPRESSION: No acute intracranial abnormality.  Age indeterminate small lacunar infarct involving the right caudate body.  Diffuse atrophy and evidence for chronic small vessel  ischemic changes.   Original Report Authenticated By: Richarda Overlie, M.D.    Dg Chest Port 1 View  06/07/2012  *RADIOLOGY REPORT*  Clinical Data: Altered mental status.  PORTABLE CHEST - 1 VIEW  Comparison: 12/19/2011  Findings: The cardiomediastinal silhouette is unremarkable. The lungs are clear. There is no evidence of focal airspace disease, pulmonary edema, suspicious pulmonary nodule/mass, pleural effusion, or pneumothorax. No acute bony abnormalities are identified.  IMPRESSION: No evidence of active cardiopulmonary disease.   Original Report Authenticated By: Rosendo Gros, M.D.     Scheduled Meds:   . calcium carbonate  2 tablet Oral Daily  .  ceFAZolin (ANCEF) IV  1 g Intravenous Q8H  . cholecalciferol  1,000 Units Oral Daily  . enalapril  20 mg Oral Daily  . famotidine  20 mg Oral BID  . furosemide  40 mg Oral Daily  . metoprolol succinate  100 mg Oral Daily  . DISCONTD: sodium chloride   Intravenous STAT  . DISCONTD: furosemide  80 mg Oral Daily   Continuous Infusions:   ________________________________________________________________________  Time spent: 30  min    [facility]  [facility] Pager [phone] If 8PM-8AM, please contact night-coverage at [email or web address], password [facility] 06/08/2012, 5:48 PM  LOS: 1 day

## 2012-06-08 NOTE — Progress Notes (Signed)
VASCULAR LAB PRELIMINARY  PRELIMINARY  PRELIMINARY  PRELIMINARY  Bilateral lower extremity venous Dopplers completed.    Preliminary report:  There is no DVT or SVT noted in the bilateral lower extremities.  Gionni Freese, 06/08/2012, 4:26 PM

## 2012-06-09 DIAGNOSIS — N179 Acute kidney failure, unspecified: Secondary | ICD-10-CM

## 2012-06-09 DIAGNOSIS — R4182 Altered mental status, unspecified: Principal | ICD-10-CM

## 2012-06-09 DIAGNOSIS — L03119 Cellulitis of unspecified part of limb: Secondary | ICD-10-CM

## 2012-06-09 DIAGNOSIS — H906 Mixed conductive and sensorineural hearing loss, bilateral: Secondary | ICD-10-CM

## 2012-06-09 MED ORDER — TOBRAMYCIN 0.3 % OP SOLN
2.0000 [drp] | OPHTHALMIC | Status: DC
  Administered 2012-06-09 – 2012-06-11 (×9): 2 [drp] via OPHTHALMIC
  Filled 2012-06-09: qty 5

## 2012-06-09 MED ORDER — ENOXAPARIN SODIUM 40 MG/0.4ML ~~LOC~~ SOLN
40.0000 mg | SUBCUTANEOUS | Status: DC
  Administered 2012-06-09 – 2012-06-11 (×3): 40 mg via SUBCUTANEOUS
  Filled 2012-06-09 (×3): qty 0.4

## 2012-06-09 NOTE — Progress Notes (Signed)
Clinical Social Work Department BRIEF PSYCHOSOCIAL ASSESSMENT 06/09/2012  Patient:  [name], [name]     Account Number:  [id number]     Admit date:  06/07/2012  Clinical Social Worker:  Orpah Greek  Date/Time:  06/09/2012 03:04 PM  Referred by:  Physician  Date Referred:  06/09/2012 Referred for  Other - See comment   Other Referral:   Admitted from: [facility]   Interview type:  Family Other interview type:    PSYCHOSOCIAL DATA Living Status:  FACILITY Admitted from facility:   Level of care:  Assisted Living Primary support name:  Sandi Raveling (daughter) h#: [phone] c#: [phone] Primary support relationship to patient:  CHILD, ADULT Degree of support available:   good    CURRENT CONCERNS Current Concerns  Post-Acute Placement   Other Concerns:    SOCIAL WORK ASSESSMENT / PLAN CSW spoke with patient & daughter, [name] at bedside re: discharge planning. Patient was admitted from [facility] ALF and plans to return there at discharge.   Assessment/plan status:  Information/Referral to [facility] Other assessment/ plan:   Information/referral to community resources:   CSW completed FL2 and faxed information to [facility]F, confirmed with [facility] @ ALF that patient is ok to return. Per Cordelia Pen, her baseline is 2 person assist.    PATIENT'S/FAMILY'S RESPONSE TO PLAN OF CARE: Patient & daughter are hoping she is able to return to ALF at discharge, and doesn't need to go to SNF.        Unice Bailey, LCSW [facility] Clinical Social Worker cell #: [phone]

## 2012-06-09 NOTE — Evaluation (Signed)
Physical Therapy Evaluation Patient Details Name: [name] MRN: [id number] DOB: Sep 01, 1925 Today's Date: 06/09/2012 Time: 1420-1450 PT Time Calculation (min): 30 min  PT Assessment / Plan / Recommendation Clinical Impression  78 yo female admitted with AMS, bil LE edema. Pt has not been able to tolerate mobility last few months due to pain in bil LEs. Has performed very little transfers OOB to chair. Pt's daughter present and states plan is for pt to d/c back to ALF-faciity has been caring for pt.  Recommend return to ALF with HHPT    PT Assessment  Patient needs continued PT services    Follow Up Recommendations  Home health PT    Barriers to Discharge        Equipment Recommendations  None recommended by PT    Recommendations for Other Services OT consult   Frequency Min 3X/week    Precautions / Restrictions Precautions Precautions: Fall Restrictions Weight Bearing Restrictions: No   Pertinent Vitals/Pain       Mobility  Bed Mobility Bed Mobility: Supine to Sit Supine to Sit: 3: Mod assist Details for Bed Mobility Assistance: Assist for bil LEs off bed. Multimodal cues for safety, technique, hand placement Transfers Transfers: Sit to Stand;Stand to Sit;Stand Pivot Transfers Sit to Stand: 1: +2 Total assist;From bed;From elevated surface;From chair/3-in-1;With armrests Sit to Stand: Patient Percentage: 30% Stand to Sit: 1: +2 Total assist;To chair/3-in-1;With upper extremity assist Stand to Sit: Patient Percentage: 30% Stand Pivot Transfers: 1: +2 Total assist Stand Pivot Transfers: Patient Percentage: 30% Details for Transfer Assistance: Multimodal cues for safety, technique, hand placement. Assist to rise, stabilize, control descent, maneuver with RW. Stand-pivot with RW to BSC-assist to weightshift and guide bottom around to chair. Sit>stand x 4.  Ambulation/Gait Ambulation/Gait Assistance: Not tested (comment) Ambulation/Gait Assistance Details: Pt unable    Exercises     PT Diagnosis: Difficulty walking;Generalized weakness;Acute pain  PT Problem List: Decreased strength;Decreased activity tolerance;Decreased balance;Decreased mobility;Pain;Decreased knowledge of use of DME;Decreased cognition PT Treatment Interventions: DME instruction;Gait training;Functional mobility training;Therapeutic activities;Therapeutic exercise;Patient/family education   PT Goals Acute Rehab PT Goals PT Goal Formulation: With patient/family Time For Goal Achievement: 06/23/12 Potential to Achieve Goals: Fair Pt will go Supine/Side to Sit: with min assist PT Goal: Supine/Side to Sit - Progress: Goal set today Pt will go Sit to Supine/Side: with min assist PT Goal: Sit to Supine/Side - Progress: Goal set today Pt will go Sit to Stand: with mod assist PT Goal: Sit to Stand - Progress: Goal set today Pt will Transfer Bed to Chair/Chair to Bed: with mod assist PT Transfer Goal: Bed to Chair/Chair to Bed - Progress: Goal set today  Visit Information  Last PT Received On: 06/09/12 Assistance Needed: +2    Subjective Data  Subjective: "I'll do whatever you want me to do" Patient Stated Goal: "Run down the hall"   Prior Functioning  Home Living Type of Home: Assisted living Prior Function Level of Independence: Needs assistance Communication Communication: HOH    Cognition  Overall Cognitive Status: Impaired Area of Impairment: Problem solving;Safety/judgement;Following commands Arousal/Alertness: Awake/alert Orientation Level: Disoriented to;Situation;Time Following Commands: Follows one step commands inconsistently    Extremity/Trunk Assessment Right Lower Extremity Assessment RLE ROM/Strength/Tone: Deficits;Unable to fully assess;Due to pain RLE ROM/Strength/Tone Deficits: Strength at least 3/5 with activity.  Left Lower Extremity Assessment LLE ROM/Strength/Tone: Deficits;Unable to fully assess;Due to pain LLE ROM/Strength/Tone Deficits: Strength  at least 3/5 with activity.    Balance Balance Balance Assessed: Yes Static Standing Balance  Static Standing - Balance Support: Bilateral upper extremity supported Static Standing - Level of Assistance: 1: +2 Total assist  End of Session PT - End of Session Equipment Utilized During Treatment: Gait belt Activity Tolerance: Patient limited by pain Patient left: in chair;with call bell/phone within reach;with family/visitor present Nurse Communication: Need for lift equipment;Mobility status  GP     Rebeca Alert [facility] 06/09/2012, 3:45 PM [phone]

## 2012-06-09 NOTE — Progress Notes (Signed)
Subjective: Patient admitted with mental status changes and possible infection by TRH. As a long term patient of mine will change attending and assume her care.   Patient awake and alert but very HOH. She has no pain unless her legs a manipulated Objective: Lab: Lab Results  Component Value Date   WBC 7.1 06/07/2012   HGB 10.0* 06/07/2012   HCT 29.2* 06/07/2012   MCV 96.1 06/07/2012   PLT 357 06/07/2012   BMET    Component Value Date/Time   NA 134* 06/08/2012 1317   K 4.0 06/08/2012 1317   CL 94* 06/08/2012 1317   CO2 32 06/08/2012 1317   GLUCOSE 137* 06/08/2012 1317   BUN 44* 06/08/2012 1317   CREATININE 1.91* 06/08/2012 1317   CALCIUM 9.1 06/08/2012 1317   GFRNONAA 23* 06/08/2012 1317   GFRAA 26* 06/08/2012 1317     Imaging: no new imaging  Scheduled Meds:   . calcium carbonate  2 tablet Oral Daily  .  ceFAZolin (ANCEF) IV  1 g Intravenous Q8H  . cholecalciferol  1,000 Units Oral Daily  . enalapril  20 mg Oral Daily  . famotidine  20 mg Oral BID  . furosemide  40 mg Oral Daily  . metoprolol succinate  100 mg Oral Daily  . DISCONTD: furosemide  80 mg Oral Daily   Continuous Infusions:  PRN Meds:.acetaminophen, oxyCODONE-acetaminophen   Physical Exam: Filed Vitals:   06/09/12 0600  BP: 146/72  Pulse: 63  Temp: 98.1 F (36.7 C)  Resp: 18    Intake/Output Summary (Last 24 hours) at 06/09/12 0818 Last data filed at 06/09/12 0600  Gross per 24 hour  Intake    480 ml  Output   3950 ml  Net  -3470 ml   Elderly white woman in no acute distress HEENT - poor dentition, C&S clear Cor- 2+ radial and DP pulses, feet are warm, RRR PUlm - normal respirations Abd- soft Ext- no deformity hands or legs Derm - minimal slight erythema of the distal LE which is chronic, not had to touch, no blisters or skin breakdown - tender to touch Neuro - HOH, oriented to person and examiner, seems oriented to place.      Assessment/Plan: 1. Mental status changes - seems to be at her  baseline, but states "I am going crazy" but is not specific. Her hearing loss contributes to the difficulty in assessment. MRI mentioned in admit note is pending.  Plan - orient patient  Assist with hearing aid.  2. LE edema - this has been a chronic problem and actually looks better than in the past. She has chronic hyperemia of the legs. No skin breakdown noted. DAy #3 Ancef  3. ARF - base;ome creatinine 1.3. She has negative fluid balance. Leg swelling in the past thought du eto venous insufficiency, not cardiac or renal and would not treat with diuretics.   Plan - continue lasix at 40 mg daily  PO fluids unrestricted  F/u Bmet  4. Dispo - had been in SNf thenAL now moved back to home. Will try to talk with family later today to develop plan  Illene Regulus [facility] (o) [phone]; (c) [phone] Call-grp - Patsi Sears IM Tele: [phone]  06/09/2012, 8:16 AM

## 2012-06-10 DIAGNOSIS — H1030 Unspecified acute conjunctivitis, unspecified eye: Secondary | ICD-10-CM

## 2012-06-10 DIAGNOSIS — N179 Acute kidney failure, unspecified: Secondary | ICD-10-CM

## 2012-06-10 LAB — BASIC METABOLIC PANEL
Chloride: 95 mEq/L — ABNORMAL LOW (ref 96–112)
Creatinine, Ser: 1.71 mg/dL — ABNORMAL HIGH (ref 0.50–1.10)
GFR calc Af Amer: 30 mL/min — ABNORMAL LOW (ref >90)
GFR calc non Af Amer: 26 mL/min — ABNORMAL LOW (ref >90)
Potassium: 3.7 mEq/L (ref 3.5–5.1)

## 2012-06-10 LAB — CBC WITH DIFFERENTIAL/PLATELET
Basophils Absolute: 0 10*3/uL (ref 0.0–0.1)
HCT: 26.4 % — ABNORMAL LOW (ref 36.0–46.0)
Lymphocytes Relative: 27 % (ref 12–46)
Monocytes Absolute: 0.9 10*3/uL (ref 0.1–1.0)
Neutro Abs: 4.2 10*3/uL (ref 1.7–7.7)
Neutrophils Relative %: 55 % (ref 43–77)
RDW: 12.2 % (ref 11.5–15.5)
WBC: 7.6 10*3/uL (ref 4.0–10.5)

## 2012-06-10 NOTE — Plan of Care (Signed)
Problem: Phase I Progression Outcomes Goal: OOB as tolerated unless otherwise ordered Outcome: Adequate for Discharge See note from PT

## 2012-06-10 NOTE — Progress Notes (Signed)
Per MD, Pt not medically ready for d/c today.  MD anticipates that Pt will be ready for d/c tomorrow.  CSW to continue to follow.  Providence Crosby, LCSWA Clinical Social Work [phone]

## 2012-06-10 NOTE — Progress Notes (Signed)
Subjective: Voices no complaint. Seems oriented and calm  Objective: Lab: Lab Results  Component Value Date   WBC 7.6 06/10/2012   HGB 8.9* 06/10/2012   HCT 26.4* 06/10/2012   MCV 95.7 06/10/2012   PLT 311 06/10/2012   BMET    Component Value Date/Time   NA 136 06/10/2012 0500   K 3.7 06/10/2012 0500   CL 95* 06/10/2012 0500   CO2 30 06/10/2012 0500   GLUCOSE 100* 06/10/2012 0500   BUN 36* 06/10/2012 0500   CREATININE 1.71* 06/10/2012 0500   CALCIUM 9.2 06/10/2012 0500   GFRNONAA 26* 06/10/2012 0500   GFRAA 30* 06/10/2012 0500     Imaging: No new imaging  Scheduled Meds:   . calcium carbonate  2 tablet Oral Daily  .  ceFAZolin (ANCEF) IV  1 g Intravenous Q8H  . cholecalciferol  1,000 Units Oral Daily  . enalapril  20 mg Oral Daily  . enoxaparin (LOVENOX) injection  40 mg Subcutaneous Q24H  . famotidine  20 mg Oral BID  . furosemide  40 mg Oral Daily  . metoprolol succinate  100 mg Oral Daily  . tobramycin  2 drop Both Eyes Q4H   Continuous Infusions:  PRN Meds:.acetaminophen, oxyCODONE-acetaminophen   Physical Exam: Filed Vitals:   06/10/12 0602  BP: 134/56  Pulse: 72  Temp: 98.7 F (37.1 C)  Resp: 18   eldelry white woman in no acute distress HEENT- decreased erythema OD Cor RRR PUlm - normal respiratons Abd- soft Ext- minimal erythema, decreased edema    Assessment/Plan: 1. Mental status change - at baseline  2. LE edema - remains improved. Plan Continue to use ACE wraps  3. ARF- Creatinine down to 1.6 - continues to improve Plan  Liberal PO fluids  4. Conjuncitivitis day #2 tobraomycin Plan- wil need a full 7 days of treatment  5. Dispo - For SNF in AM   [employer] IM (o) [phone]; (c) [phone] Call-grp - Patsi Sears IM Tele: [phone]  06/10/2012, 12:48 PM

## 2012-06-11 ENCOUNTER — Other Ambulatory Visit: Payer: Self-pay | Admitting: *Deleted

## 2012-06-11 DIAGNOSIS — L0291 Cutaneous abscess, unspecified: Secondary | ICD-10-CM

## 2012-06-11 MED ORDER — ENALAPRIL MALEATE 20 MG PO TABS: 20.0000 mg | ORAL_TABLET | Freq: Every day | ORAL | Status: DC

## 2012-06-11 MED ORDER — TOBRAMYCIN 0.3 % OP SOLN: 2.0000 [drp] | OPHTHALMIC | Status: DC

## 2012-06-11 MED ORDER — TOBRAMYCIN 0.3 % OP SOLN: 2.0000 [drp] | Freq: Four times a day (QID) | OPHTHALMIC | Status: DC

## 2012-06-11 MED ORDER — OXYCODONE-ACETAMINOPHEN 5-325 MG PO TABS: 1.0000 | ORAL_TABLET | ORAL | Status: DC | PRN

## 2012-06-11 NOTE — Telephone Encounter (Signed)
Refill for patient percocet 5-325mg  q 4 hours as needed for pain. To be picked up be Solic[facility] from [place] when ready

## 2012-06-11 NOTE — Progress Notes (Signed)
Daughter at bedside sitting with patient till D/C.

## 2012-06-11 NOTE — Discharge Summary (Signed)
[name], [name]                 ACCOUNT NO.:  [id number]  MEDICAL RECORD NO.:  [id number]  LOCATION:  1511                         FACILITY:  [facility]  PHYSICIAN:  Rosalyn Gess. Keryl Gholson, MD  DATE OF BIRTH:  [birth year]  DATE OF ADMISSION:  06/07/2012 DATE OF DISCHARGE:  06/11/2012                              DISCHARGE SUMMARY   ADMITTING DIAGNOSES: 1. Mental status changes. 2. Lower extremity edema and erythema. 3. Dehydration with acute on chronic renal failure. 4. Right elbow pain.  DISCHARGE DIAGNOSES: 1. Mental status changes, resolved. 2. Lower extremity edema and erythema, improved. 3. Acute on chronic renal failure, improved. 4. Right elbow pain, stable.  CONSULTANTS:  None.  PROCEDURES: 1. CT of the brain June 07, 2012, with no acute intracranial     abnormality.  Age indeterminate small lacunar infarct involving the     right caudate body.  Diffuse atrophy and evidence for chronic small     vessel ischemic change. 2. Diagnostic elbow performed June 07, 2012, which showed no     definite acute fracture.  Suspect post surgical and chronic     degenerative changes of the elbow.  Small elbow joint effusion is     noted.  HISTORY OF PRESENT ILLNESS:  [name] is an 78 year old woman with a longstanding history of lower extremity edema with hyperemia and decreased mobility.  She also has had progressive mild dementia.  The patient was residing at [facility] in assisted living center. She was brought to the hospital due to mental status changes described as increasing evening confusion consisting being alone and fear of dying.  The patient also has had increasing edematous changes to her distal lower extremities with increased hyperemia.  Due to her increasing confusion, UA was performed at the assisted living facility was negative.  The patient in the emergency department was felt to be possibly dehydrated, there was a question of cellulitis in her distal lower  extremities.  Because of her mental status changes, possible infection in her leg, and dehydration, she was admitted to the hospital for care.  Please see the H and P for past medical history, family history, social history, and admission examination.  HOSPITAL COURSE: 1. Mental status changes.  The patient did have some nighttime     confusion and some disorientation, which seemed to clear during her     hospital stay.  Over the last 24 hours, she has been common at her     baseline.  Her family has been present and confirmed that she seems     to be returned to normal. 2. ID.  The patient with longstanding history of venous insufficiency     with chronic lower extremity edema with hyperemia.  She has had no     ulceration or open wounds.  This admission, the patient's white     count has been normal with admission level of 7100 and final white     count of 7600 on June 10, 2012.  Differential has also been     normal with the initial differential being 60% segs, 23% lymphs,     12% monos, 4% eosinophils and on the  27th differential was 55%     segs, 27%, lymphocytes, 12% monocytes, 5% eosinophils.  The patient     has been afebrile.  Ancef was administered IV over the first 48     hours of her hospitalization and was discontinued on June 10, 2012.  The patient has remained afebrile.  Her distal lower     extremities have had some mild erythema/hyperemia and remained     tender, but do not appear to be frankly infected.  At the time of     this dictation, the patient does not require any additional     antibiotic treatment for her legs.  She will need to continue to     use Ace wraps applied daily, no traction to help control her     peripheral edema. 3. Acute on chronic renal failure.  The patient's baseline creatinine     is 1.3 to 1.4.  During this admission, her creatinine had a maximum     level of 1.9 and then started to trend down so that the final     reading on June 10, 2012, was 1.71.  The patient's estimated GFR     was 26.  This is consistent with CKD III.  Plan, the patient did     remain with adequate hydrated with caution in regards to fluid     overload.  Age 38 we would continue to follow her conservatively.     If her creatinine continues to remain elevated or rise, Nephrology     consult would be appropriate. 4. Conjunctivitis.  The patient was noted to have erythema with     exudate of the right eye.  As such she had bacterial conjunctivitis     and was started on tobramycin drops.  This will need to be     continued to complete a full 7 days of therapy. 5. With the patient's mental status having returned to baseline with     any potential infection being addressed and resolved with the acute     renal failure improved, the patient at this point is ready for     transfer back to assisted living.  During her hospital stay, she     was seen by PT and OT and ongoing physical therapy was recommended.     This can be provided at her assisted living facility to home health     services.  Appropriate referral has been made. 6. Code status.  The patient is a full code at this time.  BP:  117/51   Pulse:  66   Temp:  98.2 F (36.8 C)   Resp:  18    Gen'l - elderly white woman who is asleep but easily awakened. Shows no signs of distress  HEENT- conjunctiva OD is clear, no visible exudate  Neck - supple  Cor- 2+ radial, RRR  Pulm - normal respirations, no increased WOB, no rales  Abd - soft, BS+  Ext - UE normal in appearance. LE - ACE wraps in place. At exam 8/27 PM mild LE erythema noted, no skin breakdown  Neuro - formal exam not done. HOH.  The patient's condition at the time of this dictation is medically improved and stable.  Her prognosis is guarded given her advanced age and multiple comorbidities.  The patient will be followed by attending physician group that takes care of the patient at [facility] home.  I will always be happy to  see the patient in the office on an as-needed basis.     Rosalyn Gess Zaeem Kandel, MD     MEN/MEDQ  D:  06/11/2012  T:  06/11/2012  Job:  [id number]

## 2012-06-11 NOTE — Progress Notes (Signed)
patient is ready for transfer to assisted living with home health RN and PT services.  Filed Vitals:   06/10/12 2150  BP: 117/51  Pulse: 66  Temp: 98.2 F (36.8 C)  Resp: 18   Gen'l - elderly white woman who is asleep but easily awakened. Shows no signs of distress HEENT- conjunctiva OD is clear, no visible exudate Neck - supple Cor- 2+ radial, RRR Pulm - normal respirations, no increased WOB, no rales Abd - soft, BS+ Ext - UE normal in appearance. LE - ACE wraps in place. At exam 8/27 PM mild LE erythema noted, no skin breakdown Neuro - formal exam not done. HOH.  Follow-up: facility contract physicians will see her. She may return to Dr. Debby Bud as an outpatient as she or family desires.   Dictated # [id number]  Time on d/c: 5:45-6:21

## 2012-06-13 ENCOUNTER — Emergency Department (HOSPITAL_COMMUNITY): Payer: Medicare Other

## 2012-06-13 ENCOUNTER — Emergency Department (HOSPITAL_COMMUNITY)
Admission: EM | Admit: 2012-06-13 | Discharge: 2012-06-13 | Disposition: A | Discharge status: Skilled Nursing Facility | Payer: Medicare Other | Attending: Emergency Medicine | Admitting: Emergency Medicine

## 2012-06-13 ENCOUNTER — Encounter (HOSPITAL_COMMUNITY): Payer: Self-pay | Admitting: Neurology

## 2012-06-13 DIAGNOSIS — I951 Orthostatic hypotension: Secondary | ICD-10-CM

## 2012-06-13 DIAGNOSIS — K5730 Diverticulosis of large intestine without perforation or abscess without bleeding: Secondary | ICD-10-CM | POA: Insufficient documentation

## 2012-06-13 DIAGNOSIS — K8020 Calculus of gallbladder without cholecystitis without obstruction: Secondary | ICD-10-CM | POA: Insufficient documentation

## 2012-06-13 DIAGNOSIS — F0390 Unspecified dementia without behavioral disturbance: Secondary | ICD-10-CM | POA: Insufficient documentation

## 2012-06-13 DIAGNOSIS — R109 Unspecified abdominal pain: Secondary | ICD-10-CM | POA: Insufficient documentation

## 2012-06-13 DIAGNOSIS — I700 Atherosclerosis of aorta: Secondary | ICD-10-CM | POA: Insufficient documentation

## 2012-06-13 DIAGNOSIS — I517 Cardiomegaly: Secondary | ICD-10-CM | POA: Insufficient documentation

## 2012-06-13 DIAGNOSIS — K449 Diaphragmatic hernia without obstruction or gangrene: Secondary | ICD-10-CM | POA: Insufficient documentation

## 2012-06-13 LAB — HEPATIC FUNCTION PANEL
Albumin: 3.2 g/dL — ABNORMAL LOW (ref 3.5–5.2)
Alkaline Phosphatase: 57 U/L (ref 39–117)
Bilirubin, Direct: <0.1 mg/dL (ref 0.0–0.3)
Total Bilirubin: 0.2 mg/dL — ABNORMAL LOW (ref 0.3–1.2)

## 2012-06-13 LAB — MAGNESIUM: Magnesium: 2.5 mg/dL (ref 1.5–2.5)

## 2012-06-13 LAB — CBC WITH DIFFERENTIAL/PLATELET
Basophils Absolute: 0 10*3/uL (ref 0.0–0.1)
Basophils Relative: 0 % (ref 0–1)
Eosinophils Relative: 2 % (ref 0–5)
Hemoglobin: 9.7 g/dL — ABNORMAL LOW (ref 12.0–15.0)
Lymphocytes Relative: 13 % (ref 12–46)
Lymphs Abs: 1.3 10*3/uL (ref 0.7–4.0)
MCH: 32.3 pg (ref 26.0–34.0)
Monocytes Absolute: 0.9 10*3/uL (ref 0.1–1.0)
Monocytes Relative: 9 % (ref 3–12)
Neutrophils Relative %: 76 % (ref 43–77)
RBC: 3 MIL/uL — ABNORMAL LOW (ref 3.87–5.11)
WBC: 9.6 10*3/uL (ref 4.0–10.5)

## 2012-06-13 LAB — URINALYSIS, ROUTINE W REFLEX MICROSCOPIC
Bilirubin Urine: NEGATIVE
Ketones, ur: NEGATIVE mg/dL
Nitrite: NEGATIVE
Protein, ur: NEGATIVE mg/dL
Urobilinogen, UA: 0.2 mg/dL (ref 0.0–1.0)

## 2012-06-13 LAB — BASIC METABOLIC PANEL
Calcium: 9.5 mg/dL (ref 8.4–10.5)
Creatinine, Ser: 2.31 mg/dL — ABNORMAL HIGH (ref 0.50–1.10)
GFR calc Af Amer: 21 mL/min — ABNORMAL LOW (ref >90)
GFR calc non Af Amer: 18 mL/min — ABNORMAL LOW (ref >90)
Sodium: 134 mEq/L — ABNORMAL LOW (ref 135–145)

## 2012-06-13 LAB — TROPONIN I: Troponin I: <0.3 ng/mL (ref <0.30)

## 2012-06-13 MED ORDER — SODIUM CHLORIDE 0.9 % IV BOLUS (SEPSIS)
500.0000 mL | Freq: Once | INTRAVENOUS | Status: AC
  Administered 2012-06-13: 500 mL via INTRAVENOUS

## 2012-06-13 NOTE — ED Notes (Signed)
No changes, pillow given, family at [facility], waiting on PTAR, confirmed pt on list & PTAR aware.

## 2012-06-13 NOTE — ED Notes (Addendum)
Per ems- pt comes from [facility]. Was recently discharged from hospital today pt had decreased level of consciousness, pale, diaphoretic, BP 80/40 initially by EMS. Pt c/o bilateral lower abdominal pain. Has right knot to side, tender. Pt alert to voice, answers questions appropriately. HR 58, SB, 20 to L. Hand. 100% RA, 107/49 after 100 cc NS. Pt has bilateral ace bandages to lower legs

## 2012-06-13 NOTE — ED Provider Notes (Addendum)
History     CSN: [id number]  Arrival date & time 06/13/12  1217   First MD Initiated Contact with Patient 06/13/12 1223      Chief Complaint  Patient presents with  . Near Syncope    (Consider location/radiation/quality/duration/timing/severity/associated sxs/prior treatment) HPI Comments: LEVEL 5 CAVEAT FOR SEVERE DEMENTIA. Pt is a resident at [facility] and was sent to the ER because of near syncope. Pt is unable to provide hx. Per Nursing home reports, patient was noted to be "unresponsive and unsteady" and BP was measured at 80/40. Pt's BP and mentation quickly improved. There was no LOC, seizure like activity. Pt doesn't recall the event at all. Her only complain is some periumbilical abdp ain - that is new, and is dull, tolerable with no associated n/v/f/c/diarrhea. Pt was just discharged from the hospital yday. She was admitted for lower extremity edema, possible cellulitis.  The history is provided by the patient.    Past Medical History  Diagnosis Date  . Arthritis   . Hypertension   . Hyperlipidemia   . CHF (congestive heart failure)     Past Surgical History  Procedure Date  . Joint replacement     No family history on file.  History  Substance Use Topics  . Smoking status: Never Smoker   . Smokeless tobacco: Never Used  . Alcohol Use: No    OB History    Grav Para Term Preterm Abortions TAB SAB Ect Mult Living                  Review of Systems  Unable to perform ROS: Dementia  Respiratory: Negative for chest tightness and shortness of breath.   Cardiovascular: Negative for chest pain.  Gastrointestinal: Positive for abdominal pain. Negative for nausea and vomiting.  Genitourinary: Negative for dysuria.  Neurological: Negative for weakness and headaches.    Allergies  Ciprofloxacin; Codeine; Demerol; Morphine and related; and Nitrofuran derivatives  Home Medications   Current Outpatient Rx  Name Route Sig Dispense Refill   . AMLODIPINE BESYLATE 5 MG PO TABS Oral Take 5 mg by mouth daily.    Marland Kitchen CALCIUM CARBONATE ANTACID 500 MG PO CHEW Oral Chew 2 tablets by mouth daily.    Marland Kitchen VITAMIN D 1000 UNITS PO TABS Oral Take 1,000 Units by mouth daily.    Marland Kitchen CRANBERRY 200 MG PO CAPS Oral Take 200 mg by mouth 2 (two) times daily.    . ASPIRIN-DIPYRIDAMOLE ER 25-200 MG PO CP12 Oral Take 1 capsule by mouth 2 (two) times daily.    . DONEPEZIL HCL 10 MG PO TABS Oral Take 10 mg by mouth at bedtime.    . ENALAPRIL MALEATE 20 MG PO TABS Oral Take 1 tablet (20 mg total) by mouth daily.    Marland Kitchen FAMOTIDINE 20 MG PO TABS Oral Take 20 mg by mouth 2 (two) times daily.    Marland Kitchen FERROUS SULFATE 325 (65 FE) MG PO TABS Oral Take 325 mg by mouth 2 (two) times daily.    . FUROSEMIDE 40 MG PO TABS Oral Take 40 mg by mouth daily.    Marland Kitchen LORAZEPAM 0.5 MG PO TABS Oral Take 0.5 mg by mouth every 6 (six) hours as needed. Agitation    . BIOFREEZE EX Apply externally Apply 4 application topically daily as needed. Pain    . METOPROLOL SUCCINATE ER 100 MG PO TB24 Oral Take 100 mg by mouth daily. Take with or immediately following a meal.    .  OXYCODONE-ACETAMINOPHEN 5-325 MG PO TABS Oral Take 1 tablet by mouth every 4 (four) hours as needed. Pain 30 tablet 0  . TOBRAMYCIN SULFATE 0.3 % OP SOLN Both Eyes Place 2 drops into both eyes every 4 (four) hours.      BP 129/51  Pulse 64  Temp 97.6 F (36.4 C) (Oral)  Resp 19  SpO2 100%  Physical Exam  Constitutional: She is oriented to person, place, and time. She appears well-developed and well-nourished.  HENT:  Head: Normocephalic and atraumatic.  Eyes: EOM are normal. Pupils are equal, round, and reactive to light.  Neck: Neck supple. No JVD present.  Cardiovascular: Normal rate, regular rhythm and normal heart sounds.   No murmur heard. Pulmonary/Chest: Effort normal. No respiratory distress.  Abdominal: Soft. She exhibits no distension. There is no tenderness. There is no rebound and no guarding.        Periumbilical tenderness, no rebound or guarding  Neurological: She is alert and oriented to person, place, and time.  Skin: Skin is warm and dry.    ED Course  Procedures (including critical care time)  Labs Reviewed  CBC WITH DIFFERENTIAL - Abnormal; Notable for the following:    RBC 3.00 (*)     Hemoglobin 9.7 (*)     HCT 29.2 (*)     All other components within normal limits  BASIC METABOLIC PANEL - Abnormal; Notable for the following:    Sodium 134 (*)     Chloride 94 (*)     Glucose, Bld 119 (*)     BUN 50 (*)     Creatinine, Ser 2.31 (*)     GFR calc non Af Amer 18 (*)     GFR calc Af Amer 21 (*)     All other components within normal limits  HEPATIC FUNCTION PANEL - Abnormal; Notable for the following:    Albumin 3.2 (*)     Total Bilirubin 0.2 (*)     All other components within normal limits  TROPONIN I  LIPASE, BLOOD  URINALYSIS, ROUTINE W REFLEX MICROSCOPIC   US Aorta  06/13/2012  *RADIOLOGY REPORT*  Clinical Data: Near-syncope.  Hypertension.  Congestive heart failure.  ULTRASOUND AORTA  Technique: Aortic ultrasound was employed.  Comparison:  03/30/2010  Findings: Proximal abdominal aorta measures proximal abdominal aorta measures 2.1 cm in diameter.  Mid abdominal aorta measures 2.1 cm in diameter.  Distal abdominal aorta could not be visualized due to overlying bowel gas.  IMPRESSION:  1.  Proximal and mid abdominal aorta appear normal.  Distal abdominal aorta is completely obscured by bowel gas.  Noncontrast CT the abdomen may be helpful for definitive characterization of the infrarenal abdominal aorta.   Original Report Authenticated By: Dellia Cloud, M.D.    Dg Abd Acute W/chest  06/13/2012  *RADIOLOGY REPORT*  Clinical Data: Near syncope.  Mid upper abdominal pain.  ACUTE ABDOMEN SERIES (ABDOMEN 2 VIEW & CHEST 1 VIEW)  Comparison: Pelvic and lumbar spine films of 03/30/2010.  Chest film of 12/19/2011.  Findings: Frontal view of the chest demonstrates  patient rotated to the left.  Moderate cardiomegaly with atherosclerosis in the transverse aorta.  Moderate to large hiatal hernia. No congestive failure.  No pleural effusion or pneumothorax.  Patchy bibasilar scarring or atelectasis.  Abominal films demonstrate no free intraperitoneal air or significant air fluid levels on right side up decubitus imaging.  Supine views demonstrate no significant bowel distention.  Mild convex left lumbar spine curvature with atherosclerosis  of the aorta.  Large amount stool in the rectum.  Moderate osteopenia.  No abnormal abdominal calcifications.  IMPRESSION:  1. Possible constipation. 2.  Otherwise, no acute findings. 3.  Cardiomegaly and a moderate to large hiatal hernia.   Original Report Authenticated By: Consuello Bossier, M.D.      No diagnosis found.    MDM   Date: 06/13/2012  Rate:65  Rhythm: normal sinus rhythm  QRS Axis: normal  Intervals: PR prolonged  ST/T Wave abnormalities: normal  Conduction Disutrbances:first-degree A-V block   Narrative Interpretation:   Old EKG Reviewed: unchanged  DDx includes: Orthostatic hypotension Stroke Vertebral artery dissection/stenosis Dysrhythmia PE Vasovagal/neurocardiogenic syncope Aortic stenosis Valvular disorder/Cardiomyopathy Anemia AAA  PT with dementia comes in with cc of near syncope. Pt had an episode of hypotension noted as well - however BP here WNL. Pt has CHF - but her last EF is 60% per last echo just few months back.  We will get basic labs. We will get orthostatic labs. We will get CXR, UA and trops with EKG. US aorta ordered to ensure there is no AAA.  If all the results ae WNL, will reassess and decide on admission vs. Discharge.    Derwood Kaplan, MD 06/13/12 1533  Patients workup so far show orthostatisis and slight creatinine bump. UA is pending. CT abd w/o contrast is pending. Abd exam is non peritoneal, so if the CT is neg, we will discharge her.   Derwood Kaplan,  MD 06/13/12 1540

## 2012-06-13 NOTE — ED Notes (Signed)
Pt lying in stretcher in h/w, alert, NAD, calm, interactive, stretcher pulled into h/w at nurses station, family at [facility], waiting on ambulance.

## 2012-06-13 NOTE — ED Notes (Signed)
Waiting for PTAR 

## 2012-06-13 NOTE — ED Notes (Signed)
PTAR here for transport, care handed over to PTAR, family present and will follow to NH, no changes, pt alert, NAD, calm interactive

## 2012-06-13 NOTE — ED Notes (Signed)
[facility]ed [facility] gave report to [place]. PTAR is being called to transport patient.

## 2013-01-03 ENCOUNTER — Emergency Department (HOSPITAL_COMMUNITY)
Admission: EM | Admit: 2013-01-03 | Discharge: 2013-01-03 | Disposition: A | Discharge status: Home / Self Care | Payer: Medicare Other | Attending: Emergency Medicine | Admitting: Emergency Medicine

## 2013-01-03 ENCOUNTER — Encounter (HOSPITAL_COMMUNITY): Payer: Self-pay

## 2013-01-03 ENCOUNTER — Emergency Department (HOSPITAL_COMMUNITY): Payer: Medicare Other

## 2013-01-03 DIAGNOSIS — S79929A Unspecified injury of unspecified thigh, initial encounter: Secondary | ICD-10-CM | POA: Insufficient documentation

## 2013-01-03 DIAGNOSIS — W19XXXA Unspecified fall, initial encounter: Secondary | ICD-10-CM

## 2013-01-03 DIAGNOSIS — S0083XA Contusion of other part of head, initial encounter: Secondary | ICD-10-CM

## 2013-01-03 DIAGNOSIS — I509 Heart failure, unspecified: Secondary | ICD-10-CM | POA: Insufficient documentation

## 2013-01-03 DIAGNOSIS — Z96698 Presence of other orthopedic joint implants: Secondary | ICD-10-CM | POA: Insufficient documentation

## 2013-01-03 DIAGNOSIS — S0003XA Contusion of scalp, initial encounter: Secondary | ICD-10-CM | POA: Insufficient documentation

## 2013-01-03 DIAGNOSIS — M129 Arthropathy, unspecified: Secondary | ICD-10-CM | POA: Insufficient documentation

## 2013-01-03 DIAGNOSIS — K219 Gastro-esophageal reflux disease without esophagitis: Secondary | ICD-10-CM | POA: Insufficient documentation

## 2013-01-03 DIAGNOSIS — E785 Hyperlipidemia, unspecified: Secondary | ICD-10-CM | POA: Insufficient documentation

## 2013-01-03 DIAGNOSIS — S79912A Unspecified injury of left hip, initial encounter: Secondary | ICD-10-CM

## 2013-01-03 DIAGNOSIS — F0390 Unspecified dementia without behavioral disturbance: Secondary | ICD-10-CM | POA: Insufficient documentation

## 2013-01-03 DIAGNOSIS — Z872 Personal history of diseases of the skin and subcutaneous tissue: Secondary | ICD-10-CM | POA: Insufficient documentation

## 2013-01-03 DIAGNOSIS — Y9389 Activity, other specified: Secondary | ICD-10-CM | POA: Insufficient documentation

## 2013-01-03 DIAGNOSIS — Y9289 Other specified places as the place of occurrence of the external cause: Secondary | ICD-10-CM | POA: Insufficient documentation

## 2013-01-03 DIAGNOSIS — W06XXXA Fall from bed, initial encounter: Secondary | ICD-10-CM | POA: Insufficient documentation

## 2013-01-03 DIAGNOSIS — S79919A Unspecified injury of unspecified hip, initial encounter: Secondary | ICD-10-CM | POA: Insufficient documentation

## 2013-01-03 DIAGNOSIS — S1093XA Contusion of unspecified part of neck, initial encounter: Secondary | ICD-10-CM | POA: Insufficient documentation

## 2013-01-03 DIAGNOSIS — I10 Essential (primary) hypertension: Secondary | ICD-10-CM | POA: Insufficient documentation

## 2013-01-03 DIAGNOSIS — Z79899 Other long term (current) drug therapy: Secondary | ICD-10-CM | POA: Insufficient documentation

## 2013-01-03 HISTORY — DX: Cellulitis of right lower limb: L03.115

## 2013-01-03 HISTORY — DX: Unspecified abnormalities of gait and mobility: R26.9

## 2013-01-03 HISTORY — DX: Cellulitis of left lower limb: L03.116

## 2013-01-03 HISTORY — DX: Gastro-esophageal reflux disease without esophagitis: K21.9

## 2013-01-03 LAB — BASIC METABOLIC PANEL
BUN: 40 mg/dL — ABNORMAL HIGH (ref 6–23)
Calcium: 9.6 mg/dL (ref 8.4–10.5)
Creatinine, Ser: 1.79 mg/dL — ABNORMAL HIGH (ref 0.50–1.10)
GFR calc non Af Amer: 25 mL/min — ABNORMAL LOW (ref >90)
Glucose, Bld: 151 mg/dL — ABNORMAL HIGH (ref 70–99)
Potassium: 4 mEq/L (ref 3.5–5.1)

## 2013-01-03 LAB — CBC WITH DIFFERENTIAL/PLATELET
Eosinophils Absolute: 0.2 10*3/uL (ref 0.0–0.7)
Eosinophils Relative: 2 % (ref 0–5)
Hemoglobin: 9.6 g/dL — ABNORMAL LOW (ref 12.0–15.0)
Lymphs Abs: 1.7 10*3/uL (ref 0.7–4.0)
MCH: 32.2 pg (ref 26.0–34.0)
MCV: 95.6 fL (ref 78.0–100.0)
Monocytes Absolute: 0.8 10*3/uL (ref 0.1–1.0)
Monocytes Relative: 8 % (ref 3–12)
RBC: 2.98 MIL/uL — ABNORMAL LOW (ref 3.87–5.11)

## 2013-01-03 LAB — URINE MICROSCOPIC-ADD ON

## 2013-01-03 LAB — URINALYSIS, ROUTINE W REFLEX MICROSCOPIC
Bilirubin Urine: NEGATIVE
Hgb urine dipstick: NEGATIVE
Specific Gravity, Urine: 1.014 (ref 1.005–1.030)
Urobilinogen, UA: 0.2 mg/dL (ref 0.0–1.0)

## 2013-01-03 NOTE — ED Notes (Signed)
Patient comes from [facility] assisted living, [facility] unit. Staff stated patient fell must have fallen sometime between last night and this morning as evidenced by a hematoma on left forehead. Fall was unwitnessed. EMS states that patient fell last night.

## 2013-01-03 NOTE — ED Notes (Signed)
Patient complains of pain in bilateral lower extremities. She has ace wraps to bilateral lower extremities. +3 edema in both feet.

## 2013-01-03 NOTE — ED Notes (Signed)
GEX:BM84<XL> Expected date:<BR> Expected time:<BR> Means of arrival:<BR> Comments:<BR> 79 y/o F fall

## 2013-01-03 NOTE — ED Notes (Signed)
PTR and  [facility] called-report given.

## 2013-01-03 NOTE — ED Notes (Signed)
Patient transported to CT 

## 2013-01-03 NOTE — ED Notes (Signed)
1st attempt to obtain labs, pt is in x-ray.

## 2013-01-03 NOTE — ED Provider Notes (Signed)
History     CSN: [id number]  Arrival date & time 01/03/13  1011   First MD Initiated Contact with Patient 01/03/13 1016      Chief Complaint  Patient presents with  . Fall    (Consider location/radiation/quality/duration/timing/severity/associated sxs/prior treatment) HPI Comments: Patient is an 79 year old female with a history of dementia who presents to the ED from [facility] for a fall that occurred sometime between last night and this morning. Patient reports rolling over in bed and falling out, landing on her left hip and hitting her left forehead. Patient reports dull, aching pain that is moderate now. Palpation of the areas makes the pain worse. Nothing makes the pain better. Patient denies chest pain, dizziness, LOC currently and any time surrounding the fall. No other injuries. No associated symptoms. Patient does not take blood thinners.   Patient is a 79 y.o. female presenting with fall.  Fall    Past Medical History  Diagnosis Date  . Arthritis   . Hypertension   . Hyperlipidemia   . CHF (congestive heart failure)   . Cellulitis of right leg   . Cellulitis of left leg   . Esophageal reflux   . Abnormal gait     Past Surgical History  Procedure Laterality Date  . Joint replacement      No family history on file.  History  Substance Use Topics  . Smoking status: Never Smoker   . Smokeless tobacco: Never Used  . Alcohol Use: No    OB History   Grav Para Term Preterm Abortions TAB SAB Ect Mult Living                  Review of Systems  HENT: Positive for facial swelling.   Musculoskeletal: Positive for arthralgias.  All other systems reviewed and are negative.    Allergies  Ciprofloxacin; Codeine; Demerol; Morphine and related; and Nitrofuran derivatives  Home Medications   Current Outpatient Rx  Name  Route  Sig  Dispense  Refill  . amLODipine (NORVASC) 5 MG tablet   Oral   Take 5 mg by mouth daily.         . calcium carbonate  (TUMS - DOSED IN MG ELEMENTAL CALCIUM) 500 MG chewable tablet   Oral   Chew 2 tablets by mouth daily.         . cholecalciferol (VITAMIN D) 1000 UNITS tablet   Oral   Take 1,000 Units by mouth daily.         . Cranberry 200 MG CAPS   Oral   Take 200 mg by mouth 2 (two) times daily.         Marland Kitchen dipyridamole-aspirin (AGGRENOX) 200-25 MG per 12 hr capsule   Oral   Take 1 capsule by mouth 2 (two) times daily.         Marland Kitchen donepezil (ARICEPT) 10 MG tablet   Oral   Take 10 mg by mouth at bedtime.         . enalapril (VASOTEC) 20 MG tablet   Oral   Take 1 tablet (20 mg total) by mouth daily.         . ferrous sulfate 325 (65 FE) MG tablet   Oral   Take 325 mg by mouth 2 (two) times daily.         . furosemide (LASIX) 40 MG tablet   Oral   Take 40 mg by mouth daily.         Marland Kitchen  LORazepam (ATIVAN) 0.5 MG tablet   Oral   Take 0.5 mg by mouth every 6 (six) hours as needed. Agitation         . Menthol, Topical Analgesic, (BIOFREEZE EX)   Apply externally   Apply 4 application topically daily as needed. Pain         . metoprolol succinate (TOPROL-XL) 100 MG 24 hr tablet   Oral   Take 100 mg by mouth daily. Take with or immediately following a meal.         . oxyCODONE-acetaminophen (PERCOCET/ROXICET) 5-325 MG per tablet   Oral   Take 1 tablet by mouth every 4 (four) hours as needed. Pain   30 tablet   0   . tobramycin (TOBREX) 0.3 % ophthalmic solution   Both Eyes   Place 2 drops into both eyes every 4 (four) hours.           BP 141/82  Pulse 69  Temp(Src) 97.4 F (36.3 C) (Oral)  Resp 14  SpO2 96%  Physical Exam  Nursing note and vitals reviewed. Constitutional: She is oriented to person, place, and time. She appears well-developed and well-nourished. No distress.  HENT:  Head: Normocephalic and atraumatic.  Hematoma to left forehead which is tender to palpation. No other facial tenderness to palpation.   Eyes: Conjunctivae and EOM are normal.  Pupils are equal, round, and reactive to light. No scleral icterus.  Neck: Normal range of motion.  Cardiovascular: Normal rate and regular rhythm.  Exam reveals no gallop and no friction rub.   No murmur heard. Pulmonary/Chest: Effort normal and breath sounds normal. She has no wheezes. She has no rales. She exhibits no tenderness.  Abdominal: Soft. She exhibits no distension. There is no tenderness. There is no rebound and no guarding.  Musculoskeletal: Normal range of motion.  Left posterior hip tenderness to palpation. No obvious deformity. Full ROM. Bilateral lower leg 2+ pitting edema, tenderness to palpation, and erythema.   Neurological: She is alert and oriented to person, place, and time. Coordination normal.  Strength and sensation equal and intact bilaterally. Speech is goal-oriented. Moves limbs without ataxia.   Skin: Skin is warm and dry.  Psychiatric: She has a normal mood and affect. Her behavior is normal.    ED Course  Procedures (including critical care time)  Labs Reviewed - No data to display Dg Chest 2 View  01/03/2013  *RADIOLOGY REPORT*  Clinical Data: Preop  CHEST - 2 VIEW  Comparison: 06/07/2012  Findings: Cardiomediastinal silhouette is stable.  Mild elevation of the right hemidiaphragm.  No acute infiltrate or pulmonary edema.  Mild degenerative changes thoracic spine.  IMPRESSION: No active disease.  No significant change.   Original Report Authenticated By: Natasha Mead, M.D.    Dg Hip Complete Left  01/03/2013  *RADIOLOGY REPORT*  Clinical Data: Well this morning with pain in left hip  LEFT HIP - COMPLETE 2+ VIEW  Comparison: None.  Findings: Study is limited by significant osteopenia.  Pelvic bones are intact.  There is a subtle obliquely oriented linear lucency in the intertrochanteric region of the left femur.  There is no evidence of displacement  IMPRESSION: Suspect a subtle nondisplaced intertrochanteric left femur fracture.   Original Report Authenticated By:  Esperanza Heir, M.D.    Ct Head Wo Contrast  01/03/2013  *RADIOLOGY REPORT*  Clinical Data: Fall, hematoma left forehead  CT HEAD WITHOUT CONTRAST  Technique:  Contiguous axial images were obtained from the base of the  skull through the vertex without contrast.  Comparison: 06/07/2012  Findings: Significant diffuse atrophy.  Ventricles are proportionately dilated.  No evidence of vascular territory and marked.  No hemorrhage extra-axial fluid.  Small calcified meningioma left parietal lobe stable.  Mild hematoma left frontal area.  No underlying skull fracture.  IMPRESSION: No acute traumatic injury.   Original Report Authenticated By: Esperanza Heir, M.D.    Ct Cervical Spine Wo Contrast  01/03/2013  *RADIOLOGY REPORT*  Clinical Data: Fall, neck pain  CT CERVICAL SPINE WITHOUT CONTRAST  Technique:  Multidetector CT imaging of the cervical spine was performed. Multiplanar CT image reconstructions were also generated.  Comparison: None.  Findings: There is no fracture or prevertebral soft tissue swelling.  There is a cyst in the odontoid process.  There is degenerative change at the atlantodental articulation.  There is severe degenerative disc disease at C4-5.  C5-6 shows degenerative disc disease with essential fusion at this level.  As a result there is mild reverse lordosis in the central cervical spine. There is enlargement of the thyroid diffusely with calcifications.  IMPRESSION: No acute traumatic injury.  Degenerative changes.  Thyroid goiter.   Original Report Authenticated By: Esperanza Heir, M.D.    Ct Hip Left Wo Contrast  01/03/2013  *RADIOLOGY REPORT*  Clinical Data: Fall.  Suspected subtle fracture.  CT OF THE LEFT HIP WITHOUT CONTRAST  Technique:  Multidetector CT imaging was performed according to the standard protocol. Multiplanar CT image reconstructions were also generated.  Comparison: Radiographs from 01/03/2013  Findings: Mild thickening along the iliotibial band in this patient is  chronic.  There is mild spurring of the trochanters wall but no appreciable proximal femoral fracture on CT.  Degenerative loss articular space in the left hip is noted.  Regional vascular calcifications are present.  IMPRESSION:  1.  CT does not confirm a hip fracture.  If there is a high clinical index of suspicion of occult fracture - for example of the patient is unable to bear weight - MRI does provide higher sensitivity. 2.  Vascular calcifications. 3.  Mild thickening along the left iliotibial band is chronic.   Original Report Authenticated By: Gaylyn Rong, M.D.    Dg Foot Complete Right  01/03/2013  *RADIOLOGY REPORT*  Clinical Data: Swelling and foot pain  RIGHT FOOT COMPLETE - 3+ VIEW  Comparison: None.  Findings: Osteopenia noted.  No evidence of acute fracture ordislocation.  Vascular calcifications are present.  There is significant soft tissue swelling of the dorsum of the foot. Spurring of the plantar calcaneus.  IMPRESSION:  1.  No evidence of fracture or dislocation.  Osteopenia noted. 2.  Soft tissue swelling of the dorsum of the foot.   Original Report Authenticated By: Genevive Bi, M.D.      1. Fall, initial encounter   2. Traumatic hematoma of forehead, initial encounter   3. Hip injury, left, initial encounter       MDM  10:46 AM CT head and cervical spine and left hip xray pending.  12:10 PM CT head and cervical spine unremarkable for acute changes. Left hip xray shows intertrochanteric fracture. I spoke with Dr. Ave Filter of Orthopedics who recommends to keep patient NPO and have pre-op testing done. He will see the patient and talk to the family about surgery or management.   2:07 PM Foot xray shows no fracture. CT hip does NOT confirm a fracture. Patient will be discharged without further evaluation. Patient instructed to return with worsening or concerning symptoms.  Emilia Beck, PA-C 01/03/13 1423

## 2013-01-03 NOTE — ED Notes (Signed)
Family at bedside. 

## 2013-01-04 NOTE — ED Provider Notes (Signed)
  I performed a history and physical examination of [name] and discussed her management with Ms. Szelanski.  I agree with the history, physical, assessment, and plan of care, with the following exceptions: None  I was present for the following procedures: None Time Spent in Critical Care of the patient: None Time spent in discussions with the patient and family: 1-  Aryelle Figg Corlis Leak, MD 01/04/13 1429

## 2013-02-15 ENCOUNTER — Emergency Department (HOSPITAL_COMMUNITY): Payer: Medicare Other

## 2013-02-15 ENCOUNTER — Emergency Department (HOSPITAL_COMMUNITY)
Admission: EM | Admit: 2013-02-15 | Discharge: 2013-02-15 | Disposition: A | Discharge status: Skilled Nursing Facility | Payer: Medicare Other | Attending: Emergency Medicine | Admitting: Emergency Medicine

## 2013-02-15 DIAGNOSIS — K219 Gastro-esophageal reflux disease without esophagitis: Secondary | ICD-10-CM | POA: Insufficient documentation

## 2013-02-15 DIAGNOSIS — S79919A Unspecified injury of unspecified hip, initial encounter: Secondary | ICD-10-CM | POA: Insufficient documentation

## 2013-02-15 DIAGNOSIS — F0390 Unspecified dementia without behavioral disturbance: Secondary | ICD-10-CM | POA: Insufficient documentation

## 2013-02-15 DIAGNOSIS — I509 Heart failure, unspecified: Secondary | ICD-10-CM | POA: Insufficient documentation

## 2013-02-15 DIAGNOSIS — E785 Hyperlipidemia, unspecified: Secondary | ICD-10-CM | POA: Insufficient documentation

## 2013-02-15 DIAGNOSIS — W06XXXA Fall from bed, initial encounter: Secondary | ICD-10-CM | POA: Insufficient documentation

## 2013-02-15 DIAGNOSIS — Y939 Activity, unspecified: Secondary | ICD-10-CM | POA: Insufficient documentation

## 2013-02-15 DIAGNOSIS — Z8739 Personal history of other diseases of the musculoskeletal system and connective tissue: Secondary | ICD-10-CM | POA: Insufficient documentation

## 2013-02-15 DIAGNOSIS — Y929 Unspecified place or not applicable: Secondary | ICD-10-CM | POA: Insufficient documentation

## 2013-02-15 DIAGNOSIS — Z872 Personal history of diseases of the skin and subcutaneous tissue: Secondary | ICD-10-CM | POA: Insufficient documentation

## 2013-02-15 DIAGNOSIS — Z79899 Other long term (current) drug therapy: Secondary | ICD-10-CM | POA: Insufficient documentation

## 2013-02-15 DIAGNOSIS — I10 Essential (primary) hypertension: Secondary | ICD-10-CM | POA: Insufficient documentation

## 2013-02-15 DIAGNOSIS — M25552 Pain in left hip: Secondary | ICD-10-CM

## 2013-02-15 MED ORDER — ACETAMINOPHEN 325 MG PO TABS
650.0000 mg | ORAL_TABLET | Freq: Once | ORAL | Status: AC
  Administered 2013-02-15: 650 mg via ORAL
  Filled 2013-02-15: qty 2

## 2013-02-15 NOTE — ED Provider Notes (Signed)
Medical screening examination/treatment/procedure(s) were performed by non-physician practitioner and as supervising physician I was immediately available for consultation/collaboration.  Olivia Mackie, MD 02/15/13 [phone]

## 2013-02-15 NOTE — ED Provider Notes (Signed)
History     CSN: [id number]  Arrival date & time 02/15/13  0057   First MD Initiated Contact with Patient 02/15/13 0108      No chief complaint on file.   (Consider location/radiation/quality/duration/timing/severity/associated sxs/prior treatment) HPI History provided by patient and her son.  Level 5 caveat applies because of dementia.  Pt comes from SNF.  Nursing staff called her son at midnight, reporting that she had fallen out of bed, may have hit her head, and is complaining of pain in left hip.  She is non-ambulatory because of severe arthritis.  She is not anti-coagulated.  Pt denies headache, vision changes and nausea.  Has pain in L hip only.  Past Medical History  Diagnosis Date  . Arthritis   . Hypertension   . Hyperlipidemia   . CHF (congestive heart failure)   . Cellulitis of right leg   . Cellulitis of left leg   . Esophageal reflux   . Abnormal gait     Past Surgical History  Procedure Laterality Date  . Joint replacement      No family history on file.  History  Substance Use Topics  . Smoking status: Never Smoker   . Smokeless tobacco: Never Used  . Alcohol Use: No    OB History   Grav Para Term Preterm Abortions TAB SAB Ect Mult Living                  Review of Systems  All other systems reviewed and are negative.    Allergies  Ciprofloxacin; Codeine; Demerol; Morphine and related; and Nitrofuran derivatives  Home Medications   Current Outpatient Rx  Name  Route  Sig  Dispense  Refill  . acetaminophen (TYLENOL) 500 MG tablet   Oral   Take 500 mg by mouth every 12 (twelve) hours as needed for pain.         . brimonidine (ALPHAGAN P) 0.1 % SOLN   Right Eye   Place 1 drop into the right eye 2 (two) times daily.         . calcium carbonate (TUMS - DOSED IN MG ELEMENTAL CALCIUM) 500 MG chewable tablet   Oral   Chew 2 tablets by mouth every morning.         . cholecalciferol (VITAMIN D) 1000 UNITS tablet   Oral   Take 1,000  Units by mouth every morning.          . enalapril (VASOTEC) 20 MG tablet   Oral   Take 20 mg by mouth every morning.         . famotidine (PEPCID) 20 MG tablet   Oral   Take 20 mg by mouth 2 (two) times daily.         . ferrous sulfate 325 (65 FE) MG tablet   Oral   Take 325 mg by mouth 2 (two) times daily.         . furosemide (LASIX) 40 MG tablet   Oral   Take 40 mg by mouth every morning.          . latanoprost (XALATAN) 0.005 % ophthalmic solution   Both Eyes   Place 1 drop into both eyes at bedtime.         Marland Kitchen LORazepam (ATIVAN) 0.5 MG tablet   Oral   Take 0.5 mg by mouth every 6 (six) hours as needed for anxiety (or agitation). Agitation         . metoprolol succinate (  TOPROL-XL) 100 MG 24 hr tablet   Oral   Take 100 mg by mouth daily. Take with or immediately following a meal.         . oxyCODONE-acetaminophen (PERCOCET/ROXICET) 5-325 MG per tablet   Oral   Take 1 tablet by mouth every 4 (four) hours as needed for pain. Pain         . vitamin B-12 (CYANOCOBALAMIN) 1000 MCG tablet   Oral   Take 1,000 mcg by mouth every morning.            BP 180/76  Pulse 73  Temp(Src) 97.4 F (36.3 C) (Oral)  Resp 11  SpO2 100%  Physical Exam  Nursing note and vitals reviewed. Constitutional: She is oriented to person, place, and time. She appears well-developed and well-nourished. No distress.  HENT:  Head: Normocephalic and atraumatic.  No scalp hematoma  Eyes:  Normal appearance  Neck: Normal range of motion.  Cardiovascular: Normal rate and regular rhythm.   Pulmonary/Chest: Effort normal and breath sounds normal. No respiratory distress.  Abdominal: Soft. Bowel sounds are normal. She exhibits no distension. There is no tenderness.  Musculoskeletal: Normal range of motion.  No tenderness of spine.  Pelvis stable. Mild tenderness L groin.  Pain w/ passive flexion L hip >30deg.  Full ROM RLE w/out pain.  2+ symmetric pitting LE edema.  1+ DP  pulses.  Neurological: She is alert and oriented to person, place, and time.  CN 3-12 intact.  No sensory deficits.  5/5 and equal upper and lower extremity strength.  No past pointing.   Skin: Skin is warm and dry. No rash noted.  No ecchymosis  Psychiatric: She has a normal mood and affect. Her behavior is normal.    ED Course  Procedures (including critical care time)  Labs Reviewed - No data to display Dg Hip Complete Left  02/15/2013  *RADIOLOGY REPORT*  Clinical Data: Bilateral hip pain, left greater than right status post fall.  LEFT HIP - COMPLETE 2+ VIEW  Comparison: 01/03/2013  Findings: Diffuse osteopenia.  Femoral head remains seated within the acetabulum.  No displaced fracture identified.  Advanced atherosclerotic vascular disease.  IMPRESSION: No displaced fracture identified.  Diffuse osteopenia.  In this setting, a subtle or nondisplaced fracture can be obscured, which would require MRI if clinical concern for fracture persists.   Original Report Authenticated By: Jearld Lesch, M.D.    Ct Head Wo Contrast  02/15/2013  * *RADIOLOGY REPORT*  Clinical Data:  Fall from bed, possible head trauma.  CT HEAD WITHOUT CONTRAST CT CERVICAL SPINE WITHOUT CONTRAST  Technique:  Multidetector CT imaging of the head and cervical spine was performed following the standard protocol without intravenous contrast.  Multiplanar CT image reconstructions of the cervical spine were also generated.  Comparison:  01/03/2013  CT HEAD  Findings: Prominence of the sulci, cisterns, and ventricles, in keeping with volume loss. There are subcortical and periventricular white matter hypodensities, a nonspecific finding most often seen with chronic microangiopathic changes.  There is no evidence for acute hemorrhage, overt hydrocephalus, mass lesion, or abnormal extra-axial fluid collection.  No definite CT evidence for acute cortical based (large artery) infarction. Atherosclerotic vascular calcifications.  No  displaced calvarial fracture. The visualized paranasal sinuses and mastoid air cells are predominately clear.  IMPRESSION: Volume loss and white matter changes as above.  No CT evidence for acute intracranial abnormality.  CT CERVICAL SPINE  Findings: Degraded by motion.  The lung apices are clear.  Enlarged thyroid gland and with nonspecific areas of calcification. Multilevel degenerative changes.  Maintained craniocervical relationship.  No dens fracture.  Mild anterolisthesis of C4 on C5 with C4-5 degenerative disc disease, similar to prior.  C5-6 osseous fusion.  Prevertebral soft tissues are within normal limits.  IMPRESSION: Multilevel degenerative change without acute osseous finding.  Indeterminate thyroid nodules can be evaluated with ultrasound if clinically warranted.   Original Report Authenticated By: Jearld Lesch, M.D.    Ct Cervical Spine Wo Contrast  02/15/2013  * *RADIOLOGY REPORT*  Clinical Data:  Fall from bed, possible head trauma.  CT HEAD WITHOUT CONTRAST CT CERVICAL SPINE WITHOUT CONTRAST  Technique:  Multidetector CT imaging of the head and cervical spine was performed following the standard protocol without intravenous contrast.  Multiplanar CT image reconstructions of the cervical spine were also generated.  Comparison:  01/03/2013  CT HEAD  Findings: Prominence of the sulci, cisterns, and ventricles, in keeping with volume loss. There are subcortical and periventricular white matter hypodensities, a nonspecific finding most often seen with chronic microangiopathic changes.  There is no evidence for acute hemorrhage, overt hydrocephalus, mass lesion, or abnormal extra-axial fluid collection.  No definite CT evidence for acute cortical based (large artery) infarction. Atherosclerotic vascular calcifications.  No displaced calvarial fracture. The visualized paranasal sinuses and mastoid air cells are predominately clear.  IMPRESSION: Volume loss and white matter changes as above.  No CT  evidence for acute intracranial abnormality.  CT CERVICAL SPINE  Findings: Degraded by motion.  The lung apices are clear.  Enlarged thyroid gland and with nonspecific areas of calcification. Multilevel degenerative changes.  Maintained craniocervical relationship.  No dens fracture.  Mild anterolisthesis of C4 on C5 with C4-5 degenerative disc disease, similar to prior.  C5-6 osseous fusion.  Prevertebral soft tissues are within normal limits.  IMPRESSION: Multilevel degenerative change without acute osseous finding.  Indeterminate thyroid nodules can be evaluated with ultrasound if clinically warranted.   Original Report Authenticated By: Jearld Lesch, M.D.      1. Pain in left hip       MDM  [phone] demented F sent from SNF for fall from bed w/ possible head impact and c/o L hip pain.  Pt is non-ambulatory.  On exam, no visible head trauma, no focal neuro deficits, spine non-tender, pelvis stable, painful ROM L hip.  CT head and cervical spine negative and xray L hip negative for displaced fx.  Results discussed w/ pt and her son.  Pt had relief of pain w/ tylenol.  Requested in discharge instructions that nursing staff treat pain w/ 500mg  tylenol q6hrs and that patient be re-evaluated in 48hrs if no improvement in pain.  May need outpatient MRI of L hip to r/o occult fx.          Otilio Miu, PA-C 02/15/13 [phone]

## 2013-02-15 NOTE — ED Notes (Signed)
[id number]> Expected date:<BR> Expected time:<BR> Means of arrival:<BR> Comments:<BR> M15 61F Fall head injury

## 2013-02-15 NOTE — ED Notes (Signed)
Pt has BLE pitting edema, pain in LLE with flexion. No bruising assessed to lf. Groin.

## 2013-02-15 NOTE — ED Notes (Signed)
Pt is from [facility]. She stood up off side of the bed and fell. Staff found pt yelling on the floor. C/o lt groin pain. She has severe pitting pedal edema which is her baseline. She has dementia, however x EMS reports she is A&O x 3. Able to follow commands but has more pain on lt side. No LOC per staff at [facility].

## 2013-04-05 DIAGNOSIS — S8990XA Unspecified injury of unspecified lower leg, initial encounter: Secondary | ICD-10-CM | POA: Insufficient documentation

## 2013-04-05 DIAGNOSIS — Z8739 Personal history of other diseases of the musculoskeletal system and connective tissue: Secondary | ICD-10-CM | POA: Insufficient documentation

## 2013-04-05 DIAGNOSIS — I10 Essential (primary) hypertension: Secondary | ICD-10-CM | POA: Insufficient documentation

## 2013-04-05 DIAGNOSIS — Y929 Unspecified place or not applicable: Secondary | ICD-10-CM | POA: Insufficient documentation

## 2013-04-05 DIAGNOSIS — I509 Heart failure, unspecified: Secondary | ICD-10-CM | POA: Insufficient documentation

## 2013-04-05 DIAGNOSIS — F0390 Unspecified dementia without behavioral disturbance: Secondary | ICD-10-CM | POA: Insufficient documentation

## 2013-04-05 DIAGNOSIS — R296 Repeated falls: Secondary | ICD-10-CM | POA: Insufficient documentation

## 2013-04-05 DIAGNOSIS — Z862 Personal history of diseases of the blood and blood-forming organs and certain disorders involving the immune mechanism: Secondary | ICD-10-CM | POA: Insufficient documentation

## 2013-04-05 DIAGNOSIS — Y939 Activity, unspecified: Secondary | ICD-10-CM | POA: Insufficient documentation

## 2013-04-05 DIAGNOSIS — Z8639 Personal history of other endocrine, nutritional and metabolic disease: Secondary | ICD-10-CM | POA: Insufficient documentation

## 2013-04-05 DIAGNOSIS — Z79899 Other long term (current) drug therapy: Secondary | ICD-10-CM | POA: Insufficient documentation

## 2013-04-05 DIAGNOSIS — Z872 Personal history of diseases of the skin and subcutaneous tissue: Secondary | ICD-10-CM | POA: Insufficient documentation

## 2013-04-05 DIAGNOSIS — K219 Gastro-esophageal reflux disease without esophagitis: Secondary | ICD-10-CM | POA: Insufficient documentation

## 2013-04-05 DIAGNOSIS — S0990XA Unspecified injury of head, initial encounter: Secondary | ICD-10-CM | POA: Insufficient documentation

## 2013-04-05 NOTE — ED Notes (Signed)
Per EMS, pt is from [facility] and had an unwitnessed fall. Pt is A&O to her baseline. Hx of dementia. Pt has small abrasion to the forehead ad a skin tear to the left pinky toe and bruising to the top of the left foot.

## 2013-04-06 ENCOUNTER — Emergency Department (HOSPITAL_COMMUNITY): Payer: Medicare Other

## 2013-04-06 ENCOUNTER — Emergency Department (HOSPITAL_COMMUNITY)
Admission: EM | Admit: 2013-04-06 | Discharge: 2013-04-06 | Disposition: A | Discharge status: Home / Self Care | Payer: Medicare Other | Attending: Emergency Medicine | Admitting: Emergency Medicine

## 2013-04-06 ENCOUNTER — Encounter (HOSPITAL_COMMUNITY): Payer: Self-pay | Admitting: Emergency Medicine

## 2013-04-06 DIAGNOSIS — S0990XA Unspecified injury of head, initial encounter: Secondary | ICD-10-CM

## 2013-04-06 DIAGNOSIS — S99922A Unspecified injury of left foot, initial encounter: Secondary | ICD-10-CM

## 2013-04-06 DIAGNOSIS — W19XXXA Unspecified fall, initial encounter: Secondary | ICD-10-CM

## 2013-04-06 NOTE — ED Notes (Signed)
Facility MAR notes NKDA; however, there are several meds listed in our charting system. Pt is unable to address the question d/t dementia and no family is currently at bedside.

## 2013-04-06 NOTE — ED Provider Notes (Signed)
History     CSN: [id number]  Arrival date & time 04/05/13  2357   First MD Initiated Contact with Patient 04/06/13 0026      Chief Complaint  Patient presents with  . Fall    (Consider location/radiation/quality/duration/timing/severity/associated sxs/prior treatment) Patient is a 79 y.o. female presenting with fall. The history is provided by the EMS personnel (pt fell at the [facility]). No language interpreter was used.  Fall This is a new problem. The current episode started 1 to 2 hours ago. The problem occurs rarely. The problem has not changed since onset.Pertinent negatives include no chest pain and no abdominal pain. Nothing aggravates the symptoms. Nothing relieves the symptoms.    Past Medical History  Diagnosis Date  . Arthritis   . Hypertension   . Hyperlipidemia   . CHF (congestive heart failure)   . Cellulitis of right leg   . Cellulitis of left leg   . Esophageal reflux   . Abnormal gait     Past Surgical History  Procedure Laterality Date  . Joint replacement      No family history on file.  History  Substance Use Topics  . Smoking status: Never Smoker   . Smokeless tobacco: Never Used  . Alcohol Use: No    OB History   Grav Para Term Preterm Abortions TAB SAB Ect Mult Living                  Review of Systems  Unable to perform ROS: Dementia  Cardiovascular: Negative for chest pain.  Gastrointestinal: Negative for abdominal pain.    Allergies  Review of patient's allergies indicates no known allergies.  Home Medications   Current Outpatient Rx  Name  Route  Sig  Dispense  Refill  . acetaminophen (TYLENOL) 500 MG tablet   Oral   Take 500 mg by mouth every 12 (twelve) hours as needed for pain.         . brimonidine (ALPHAGAN P) 0.1 % SOLN   Right Eye   Place 1 drop into the right eye 2 (two) times daily.         . calcium carbonate (TUMS - DOSED IN MG ELEMENTAL CALCIUM) 500 MG chewable tablet   Oral   Chew 2 tablets by mouth every  morning.         . cholecalciferol (VITAMIN D) 1000 UNITS tablet   Oral   Take 1,000 Units by mouth every morning.          . enalapril (VASOTEC) 20 MG tablet   Oral   Take 20 mg by mouth every morning.         . famotidine (PEPCID) 20 MG tablet   Oral   Take 20 mg by mouth 2 (two) times daily.         . ferrous sulfate 325 (65 FE) MG tablet   Oral   Take 325 mg by mouth 2 (two) times daily.         . furosemide (LASIX) 40 MG tablet   Oral   Take 40 mg by mouth every morning.          Marland Kitchen guaiFENesin (ROBITUSSIN) 100 MG/5ML SOLN   Oral   Take 10 mLs by mouth every 6 (six) hours as needed (cough).         . latanoprost (XALATAN) 0.005 % ophthalmic solution   Both Eyes   Place 1 drop into both eyes at bedtime.         Marland Kitchen  LORazepam (ATIVAN) 0.5 MG tablet   Oral   Take 0.5 mg by mouth every 6 (six) hours as needed for anxiety (or agitation). Agitation         . metoprolol succinate (TOPROL-XL) 100 MG 24 hr tablet   Oral   Take 100 mg by mouth every morning. Take with or immediately following a meal.         . oxyCODONE-acetaminophen (PERCOCET/ROXICET) 5-325 MG per tablet   Oral   Take 1 tablet by mouth every 4 (four) hours as needed for pain. Pain         . vitamin B-12 (CYANOCOBALAMIN) 1000 MCG tablet   Oral   Take 1,000 mcg by mouth every morning.            BP 158/65  Pulse 78  Temp(Src) 97.7 F (36.5 C) (Oral)  Resp 20  SpO2 93%  Physical Exam  Constitutional: She appears well-developed.  HENT:  Head: Normocephalic.  Eyes: Conjunctivae and EOM are normal. No scleral icterus.  Neck: Neck supple. No thyromegaly present.  Cardiovascular: Normal rate and regular rhythm.  Exam reveals no gallop and no friction rub.   No murmur heard. Pulmonary/Chest: No stridor. She has no wheezes. She has no rales. She exhibits no tenderness.  Abdominal: She exhibits no distension. There is no tenderness. There is no rebound.  Musculoskeletal: Normal  range of motion. She exhibits no edema.  brusing to left foot.  Small lac to left small toe  Lymphadenopathy:    She has no cervical adenopathy.  Neurological: She is alert. Coordination normal.  Pt oriented to person only and this is her normal  Skin: No rash noted. No erythema.  Psychiatric: She has a normal mood and affect. Her behavior is normal.    ED Course  Procedures (including critical care time)  Labs Reviewed - No data to display Dg Pelvis 1-2 Views  04/06/2013   *RADIOLOGY REPORT*  Clinical Data: Fall.  PELVIS - 1-2 VIEW  Comparison: CT 01/05/2013  Findings: Diffuse osteopenia. No acute bony abnormality. Specifically, no fracture, subluxation, or dislocation.  Soft tissues are intact.  Hip joints and SI joints are symmetric and unremarkable.  IMPRESSION: Osteopenia. No acute bony abnormality.   Original Report Authenticated By: Charlett Nose, M.D.   Ct Head Wo Contrast  04/06/2013   *RADIOLOGY REPORT*  Clinical Data:  Fall  CT HEAD WITHOUT CONTRAST CT CERVICAL SPINE WITHOUT CONTRAST  Technique:  Multidetector CT imaging of the head and cervical spine was performed following the standard protocol without intravenous contrast.  Multiplanar CT image reconstructions of the cervical spine were also generated.  Comparison:  02/15/2013  CT HEAD  Findings: Advanced atrophy.  Chronic microvascular ischemic change in the white matter.  No acute infarct.  Negative for hemorrhage or mass.  No acute skull abnormality.  IMPRESSION: Advanced atrophy.  No acute abnormality.  CT CERVICAL SPINE  Findings: Thyroid goiter with diffuse enlargement of the thyroid. Diffuse vascular calcification.  Negative for fracture.  Cervical facet degeneration at C3-4 and C4- 5.  2 mm anterior slip C4-5.  There is spondylosis at C4-5 and C5- 6.  There is a large degenerative cyst in the dens.  IMPRESSION: Cervical degenerative change and goiter  Negative for fracture.   Original Report Authenticated By: Janeece Riggers, M.D.    Ct Cervical Spine Wo Contrast  04/06/2013   *RADIOLOGY REPORT*  Clinical Data:  Fall  CT HEAD WITHOUT CONTRAST CT CERVICAL SPINE WITHOUT CONTRAST  Technique:  Multidetector CT imaging of the head and cervical spine was performed following the standard protocol without intravenous contrast.  Multiplanar CT image reconstructions of the cervical spine were also generated.  Comparison:  02/15/2013  CT HEAD  Findings: Advanced atrophy.  Chronic microvascular ischemic change in the white matter.  No acute infarct.  Negative for hemorrhage or mass.  No acute skull abnormality.  IMPRESSION: Advanced atrophy.  No acute abnormality.  CT CERVICAL SPINE  Findings: Thyroid goiter with diffuse enlargement of the thyroid. Diffuse vascular calcification.  Negative for fracture.  Cervical facet degeneration at C3-4 and C4- 5.  2 mm anterior slip C4-5.  There is spondylosis at C4-5 and C5- 6.  There is a large degenerative cyst in the dens.  IMPRESSION: Cervical degenerative change and goiter  Negative for fracture.   Original Report Authenticated By: Janeece Riggers, M.D.   Dg Foot Complete Left  04/06/2013   *RADIOLOGY REPORT*  Clinical Data: Fall, injury.  Bleeding left great toe.  LEFT FOOT - COMPLETE 3+ VIEW  Comparison: None.  Findings: Diffuse osteopenia.  Soft tissue swelling along the dorsum of the foot overlying the distal metatarsal region.  No fracture.  No subluxation or dislocation.  IMPRESSION: Dorsal soft tissue swelling.  Osteopenia.  No acute bony abnormality.   Original Report Authenticated By: Charlett Nose, M.D.     1. Fall, initial encounter   2. Head injury, initial encounter   3. Foot injury, left, initial encounter       MDM          Benny Lennert, MD 04/06/13 [phone]

## 2013-04-06 NOTE — ED Notes (Signed)
Bed:WA09<BR> Expected date:<BR> Expected time:<BR> Means of arrival:<BR> Comments:<BR> EMS

## 2013-04-06 NOTE — ED Notes (Signed)
[facility] called  

## 2013-04-06 NOTE — ED Notes (Signed)
Cleanse RLE with NSL

## 2013-04-24 ENCOUNTER — Emergency Department (HOSPITAL_COMMUNITY): Payer: Medicare Other

## 2013-04-24 ENCOUNTER — Encounter (HOSPITAL_COMMUNITY): Payer: Self-pay | Admitting: Emergency Medicine

## 2013-04-24 ENCOUNTER — Emergency Department (HOSPITAL_COMMUNITY)
Admission: EM | Admit: 2013-04-24 | Discharge: 2013-04-24 | Disposition: A | Discharge status: Skilled Nursing Facility | Payer: Medicare Other | Attending: Emergency Medicine | Admitting: Emergency Medicine

## 2013-04-24 DIAGNOSIS — F0390 Unspecified dementia without behavioral disturbance: Secondary | ICD-10-CM | POA: Insufficient documentation

## 2013-04-24 DIAGNOSIS — T887XXA Unspecified adverse effect of drug or medicament, initial encounter: Secondary | ICD-10-CM | POA: Insufficient documentation

## 2013-04-24 DIAGNOSIS — I509 Heart failure, unspecified: Secondary | ICD-10-CM | POA: Insufficient documentation

## 2013-04-24 DIAGNOSIS — K219 Gastro-esophageal reflux disease without esophagitis: Secondary | ICD-10-CM | POA: Insufficient documentation

## 2013-04-24 DIAGNOSIS — Z79899 Other long term (current) drug therapy: Secondary | ICD-10-CM | POA: Insufficient documentation

## 2013-04-24 DIAGNOSIS — T50905A Adverse effect of unspecified drugs, medicaments and biological substances, initial encounter: Secondary | ICD-10-CM

## 2013-04-24 DIAGNOSIS — F29 Unspecified psychosis not due to a substance or known physiological condition: Secondary | ICD-10-CM | POA: Insufficient documentation

## 2013-04-24 DIAGNOSIS — Z872 Personal history of diseases of the skin and subcutaneous tissue: Secondary | ICD-10-CM | POA: Insufficient documentation

## 2013-04-24 DIAGNOSIS — R413 Other amnesia: Secondary | ICD-10-CM | POA: Insufficient documentation

## 2013-04-24 DIAGNOSIS — Z862 Personal history of diseases of the blood and blood-forming organs and certain disorders involving the immune mechanism: Secondary | ICD-10-CM | POA: Insufficient documentation

## 2013-04-24 DIAGNOSIS — R109 Unspecified abdominal pain: Secondary | ICD-10-CM | POA: Insufficient documentation

## 2013-04-24 DIAGNOSIS — I10 Essential (primary) hypertension: Secondary | ICD-10-CM | POA: Insufficient documentation

## 2013-04-24 DIAGNOSIS — IMO0002 Reserved for concepts with insufficient information to code with codable children: Secondary | ICD-10-CM | POA: Insufficient documentation

## 2013-04-24 DIAGNOSIS — Z8639 Personal history of other endocrine, nutritional and metabolic disease: Secondary | ICD-10-CM | POA: Insufficient documentation

## 2013-04-24 DIAGNOSIS — Z8739 Personal history of other diseases of the musculoskeletal system and connective tissue: Secondary | ICD-10-CM | POA: Insufficient documentation

## 2013-04-24 HISTORY — DX: Unspecified dementia, unspecified severity, without behavioral disturbance, psychotic disturbance, mood disturbance, and anxiety: F03.90

## 2013-04-24 LAB — CBC WITH DIFFERENTIAL/PLATELET
Basophils Absolute: 0 K/uL (ref 0.0–0.1)
Basophils Relative: 0 % (ref 0–1)
Eosinophils Absolute: 0.2 10*3/uL (ref 0.0–0.7)
Eosinophils Relative: 3 % (ref 0–5)
HCT: 32.2 % — ABNORMAL LOW (ref 36.0–46.0)
Hemoglobin: 10.9 g/dL — ABNORMAL LOW (ref 12.0–15.0)
Lymphocytes Relative: 21 % (ref 12–46)
Lymphs Abs: 1.7 10*3/uL (ref 0.7–4.0)
MCH: 32.2 pg (ref 26.0–34.0)
MCHC: 33.9 g/dL (ref 30.0–36.0)
MCV: 95.3 fL (ref 78.0–100.0)
Monocytes Absolute: 0.7 K/uL (ref 0.1–1.0)
Monocytes Relative: 8 % (ref 3–12)
Neutro Abs: 5.4 K/uL (ref 1.7–7.7)
Neutrophils Relative %: 68 % (ref 43–77)
Platelets: 308 10*3/uL (ref 150–400)
RBC: 3.38 MIL/uL — ABNORMAL LOW (ref 3.87–5.11)
RDW: 12.3 % (ref 11.5–15.5)
WBC: 7.9 10*3/uL (ref 4.0–10.5)

## 2013-04-24 LAB — VALPROIC ACID LEVEL: Valproic Acid Lvl: 28.2 ug/mL — ABNORMAL LOW (ref 50.0–100.0)

## 2013-04-24 LAB — COMPREHENSIVE METABOLIC PANEL WITH GFR
ALT: 8 U/L (ref 0–35)
Alkaline Phosphatase: 78 U/L (ref 39–117)
CO2: 28 meq/L (ref 19–32)
Chloride: 96 meq/L (ref 96–112)
GFR calc Af Amer: 34 mL/min — ABNORMAL LOW (ref >90)
GFR calc non Af Amer: 29 mL/min — ABNORMAL LOW (ref >90)
Glucose, Bld: 106 mg/dL — ABNORMAL HIGH (ref 70–99)
Potassium: 4.6 meq/L (ref 3.5–5.1)
Sodium: 134 meq/L — ABNORMAL LOW (ref 135–145)

## 2013-04-24 LAB — RAPID URINE DRUG SCREEN, HOSP PERFORMED
Amphetamines: NOT DETECTED
Barbiturates: NOT DETECTED
Benzodiazepines: NOT DETECTED
Cocaine: NOT DETECTED
Opiates: NOT DETECTED
Tetrahydrocannabinol: NOT DETECTED

## 2013-04-24 LAB — COMPREHENSIVE METABOLIC PANEL
AST: 16 U/L (ref 0–37)
Albumin: 3.3 g/dL — ABNORMAL LOW (ref 3.5–5.2)
BUN: 34 mg/dL — ABNORMAL HIGH (ref 6–23)
Calcium: 9.4 mg/dL (ref 8.4–10.5)
Creatinine, Ser: 1.55 mg/dL — ABNORMAL HIGH (ref 0.50–1.10)
Total Bilirubin: 0.3 mg/dL (ref 0.3–1.2)
Total Protein: 7 g/dL (ref 6.0–8.3)

## 2013-04-24 LAB — URINALYSIS, ROUTINE W REFLEX MICROSCOPIC
Bilirubin Urine: NEGATIVE
Glucose, UA: NEGATIVE mg/dL
Hgb urine dipstick: NEGATIVE
Ketones, ur: NEGATIVE mg/dL
Nitrite: NEGATIVE
Protein, ur: NEGATIVE mg/dL
Specific Gravity, Urine: 1.015 (ref 1.005–1.030)
Urobilinogen, UA: 0.2 mg/dL (ref 0.0–1.0)
pH: 7 (ref 5.0–8.0)

## 2013-04-24 LAB — URINE MICROSCOPIC-ADD ON

## 2013-04-24 MED ORDER — SODIUM CHLORIDE 0.9 % IV BOLUS (SEPSIS)
500.0000 mL | Freq: Once | INTRAVENOUS | Status: AC
  Administered 2013-04-24: 500 mL via INTRAVENOUS

## 2013-04-24 NOTE — ED Notes (Signed)
Pt's family sts that Pt was recently put on Depakote and potentially other new medication.

## 2013-04-24 NOTE — Discharge Instructions (Signed)
 Polypharmacy Problems Polypharmacy problems can occur when you take more than one medicine. Your caregivers need to know about all the medicines you take. This includes vitamins, herbs, eyedrops, over-the-counter medicines, prescription medicines, and creams. Drug interaction problems can include:  Bad reactions or side effects. This can occur when certain drugs are taken together.  Reduced benefit from your medicines. This can occur if one drug reduces the ability of another drug to work. Some drug interaction problems can be life-threatening. Your caregivers can coordinate a plan to help you avoid polypharmacy problems. RISK FACTORS Polypharmacy problems are more likely to occur if:  You have many caregivers prescribing different medicines for you. This is a common problem for elderly patients.  You have a long-term (chronic) medical condition.  You have had an organ transplant.  You have human immunodeficiency virus (HIV).  You are undergoing chemotherapy.  You have hepatitis.  You have kidney disease or kidney failure.  You have significant heart disease or lung disease.  You are elderly.  You are taking medicines that have a low margin of error. This means there is little difference between the right amount of medicine and too much medicine. Medicines in this category include:  Warfarin.  Digoxin.  Lithium.  Theophylline.  Monoamine oxidase (MAO) inhibitors.  Seizure medicines.  Cyclosporine. PREVENTION   Choose one caregiver to be in charge of coordinating your medicines. Inform this caregiver about all medicines and supplements you take, even if they are prescribed by another caregiver.  Purchase all your medicines through the same pharmacy. The pharmacy keeps track of your medicines and possible drug interactions. They can notify your caregiver of potential problems.  Read the information given to you at your pharmacy.  Carry a list of all your medicines and  their doses with you. This informs your caregivers, emergency department caregivers, and specialists about the medicines you are taking. This is especially important before you start a new medicine.  Use a system to keep track of which medicines you are taking and when. Many patients use a pillbox that separates medicines by the day and time they are supposed to be taken.  Carry a list of your chronic medical problems with you. Conditions such as kidney problems, liver problems, organ transplants, and chronic viral infections affect the way your body handles medicines.  Ask your caregiver or pharmacist if you have any questions about your medicines. SEEK MEDICAL CARE IF:  You have any problems that may be caused by your medicines.  You have chest pain.  You have shortness of breath.  You have an irregular heartbeat.  You have fainting spells.  You have shaking or tremors.  You have weakness or tiredness (lethargy).  You have a rash or swelling in any part of the body.  You have increased bleeding, rectal bleeding, vaginal bleeding, or you bruise easily.  You have abdominal pain.  You have nausea.  You have vomiting. Document Released: 11/08/2004 Document Revised: 12/24/2011 Document Reviewed: 06/20/2011 [facility] Patient Information 2014 [place], [place].   I advise decreasing the sedative medications that are being used currently, in particular narcotics and benzodiazepines and discuss with your physician immediately about less frequent, or lower dosage use.

## 2013-04-24 NOTE — ED Provider Notes (Signed)
History    CSN: [id number] Arrival date & time 04/24/13  7829  First MD Initiated Contact with Patient 04/24/13 [phone]     Chief Complaint  Patient presents with  . Altered Mental Status   (Consider location/radiation/quality/duration/timing/severity/associated sxs/prior Treatment) HPI Comments: Level 5 caveat due to dementia and altered mentation.  Pt brought by EMS from nursing home, relatively new tennant by report.  Pt has had change in responsiveness, mentation.  Other than dementia, by report she is alert and oriented times 4.  Pt's MAR indicates she has been receiving percocet for pain and ativan for agitation, presumably in the past 1 week.    Patient is a 79 y.o. female presenting with altered mental status. The history is provided by the patient, the EMS personnel, medical records and the nursing home. The history is limited by the condition of the patient.  Altered Mental Status Presenting symptoms: behavior changes, confusion, lethargy, memory loss and partial responsiveness   Presenting symptoms: no combativeness   Severity:  Moderate Most recent episode:  Today Episode history:  Multiple Timing:  Constant Progression:  Worsening Chronicity:  New Context: dementia and nursing home resident   Context: not alcohol use, not head injury, not homeless and not a recent infection   Associated symptoms: abdominal pain and agitation   Associated symptoms: no difficulty breathing, no nausea, no rash and no vomiting    Past Medical History  Diagnosis Date  . Arthritis   . Hypertension   . Hyperlipidemia   . CHF (congestive heart failure)   . Cellulitis of right leg   . Cellulitis of left leg   . Esophageal reflux   . Abnormal gait   . Dementia    Past Surgical History  Procedure Laterality Date  . Joint replacement     History reviewed. No pertinent family history. History  Substance Use Topics  . Smoking status: Never Smoker   . Smokeless tobacco: Never Used  .  Alcohol Use: No   OB History   Grav Para Term Preterm Abortions TAB SAB Ect Mult Living                 Review of Systems  Unable to perform ROS: Mental status change  Gastrointestinal: Positive for abdominal pain. Negative for nausea and vomiting.  Skin: Negative for rash.  Psychiatric/Behavioral: Positive for memory loss, confusion, agitation and altered mental status.    Allergies  Review of patient's allergies indicates no known allergies.  Home Medications   Current Outpatient Rx  Name  Route  Sig  Dispense  Refill  . acetaminophen (TYLENOL) 500 MG tablet   Oral   Take 500 mg by mouth every 12 (twelve) hours as needed for pain.         . brimonidine (ALPHAGAN P) 0.1 % SOLN   Right Eye   Place 1 drop into the right eye 2 (two) times daily.         . calcium carbonate (TUMS - DOSED IN MG ELEMENTAL CALCIUM) 500 MG chewable tablet   Oral   Chew 2 tablets by mouth every morning.         . cholecalciferol (VITAMIN D) 1000 UNITS tablet   Oral   Take 1,000 Units by mouth every morning.          . divalproex (DEPAKOTE SPRINKLE) 125 MG capsule   Oral   Take 125 mg by mouth 3 (three) times daily.         Marland Kitchen  enalapril (VASOTEC) 20 MG tablet   Oral   Take 20 mg by mouth every morning.         . famotidine (PEPCID) 20 MG tablet   Oral   Take 20 mg by mouth 2 (two) times daily.         . ferrous sulfate 325 (65 FE) MG tablet   Oral   Take 325 mg by mouth 2 (two) times daily.         . furosemide (LASIX) 40 MG tablet   Oral   Take 40 mg by mouth every morning.          Marland Kitchen guaiFENesin (ROBITUSSIN) 100 MG/5ML SOLN   Oral   Take 10 mLs by mouth every 6 (six) hours as needed (cough).         . latanoprost (XALATAN) 0.005 % ophthalmic solution   Both Eyes   Place 1 drop into both eyes at bedtime.         Marland Kitchen LORazepam (ATIVAN) 0.5 MG tablet   Oral   Take 0.5 mg by mouth every 6 (six) hours as needed for anxiety (or agitation). Agitation          . metoprolol succinate (TOPROL-XL) 100 MG 24 hr tablet   Oral   Take 100 mg by mouth every morning. Take with or immediately following a meal.         . oxyCODONE-acetaminophen (PERCOCET/ROXICET) 5-325 MG per tablet   Oral   Take 1 tablet by mouth every 4 (four) hours as needed for pain.         . vitamin B-12 (CYANOCOBALAMIN) 1000 MCG tablet   Oral   Take 1,000 mcg by mouth every morning.           BP 158/69  Pulse 70  Temp(Src) 98.2 F (36.8 C) (Oral)  Resp 18  SpO2 98% Physical Exam  Nursing note and vitals reviewed. Constitutional: She appears well-developed and well-nourished. She is sleeping and cooperative. She is easily aroused.  Non-toxic appearance. She does not have a sickly appearance. No distress.  HENT:  Head: Normocephalic and atraumatic.  Eyes: Conjunctivae and EOM are normal.  Neck: Normal range of motion. Neck supple. No JVD present.  Cardiovascular: Normal rate, regular rhythm and intact distal pulses.   No murmur heard. Pulmonary/Chest: Effort normal. No respiratory distress. She has no wheezes. She has no rales.  Abdominal: Soft. She exhibits no distension. There is no tenderness. There is no rebound and no guarding.  Musculoskeletal:       Right lower leg: She exhibits edema. She exhibits no tenderness and no deformity.       Left lower leg: She exhibits edema. She exhibits no tenderness and no deformity.  Neurological: She is alert and easily aroused. She is disoriented. She displays no tremor. No cranial nerve deficit or sensory deficit. She exhibits normal muscle tone. GCS eye subscore is 3. GCS verbal subscore is 4. GCS motor subscore is 6.  Poor finger to nose, pt is not comprehending some simple commands and requests to perform neuro exam, no overt weakness on left or right, no drift  Skin: Skin is warm and dry. No ecchymosis, no lesion, no petechiae and no rash noted. She is not diaphoretic. No erythema. No pallor.    ED Course  Procedures  (including critical care time) Labs Reviewed  COMPREHENSIVE METABOLIC PANEL - Abnormal; Notable for the following:    Sodium 134 (*)    Glucose, Bld 106 (*)  BUN 34 (*)    Creatinine, Ser 1.55 (*)    Albumin 3.3 (*)    GFR calc non Af Amer 29 (*)    GFR calc Af Amer 34 (*)    All other components within normal limits  CBC WITH DIFFERENTIAL - Abnormal; Notable for the following:    RBC 3.38 (*)    Hemoglobin 10.9 (*)    HCT 32.2 (*)    All other components within normal limits  URINALYSIS, ROUTINE W REFLEX MICROSCOPIC - Abnormal; Notable for the following:    APPearance CLOUDY (*)    Leukocytes, UA SMALL (*)    All other components within normal limits  URINE MICROSCOPIC-ADD ON - Abnormal; Notable for the following:    Bacteria, UA MANY (*)    All other components within normal limits  VALPROIC ACID LEVEL - Abnormal; Notable for the following:    Valproic Acid Lvl 28.2 (*)    All other components within normal limits  URINE CULTURE  URINE RAPID DRUG SCREEN (HOSP PERFORMED)   Ct Head Wo Contrast  04/24/2013   *RADIOLOGY REPORT*  Clinical Data: Lethargy and altered mental status.  Memory loss.  CT HEAD WITHOUT CONTRAST  Technique:  Contiguous axial images were obtained from the base of the skull through the vertex without contrast.  Comparison: 04/06/2013.  Findings:  Calvarium: No acute abnormality  Orbits: Coarse calcifications is seen with the temporal right globe  Brain: No acute abnormality such as infarct, hemorrhage, hydrocephalus, or shift/mass lesion.  An apparent low attenuation in the left pons is most likely artifactual - especially when given the nonfocal history.  The pontine morphology is similar to prior. There is severe brain atrophy with remote lacunar infarcts in the right caudothalamic groove the left putaminal region.  Confluent periventricular low attenuationrelated to chronic small vessel ischemia.  Dural based 9 mm calcified nodule in the left parietal region,  likely small meningioma.  IMPRESSION: 1.  No evidence of acute intracranial disease.  2.  Advanced atrophy and chronic small vessel ischemic change.   Original Report Authenticated By: Tiburcio Pea   Dg Chest Port 1 View  04/24/2013   *RADIOLOGY REPORT*  Clinical Data: Altered mental status, weakness  PORTABLE CHEST - 1 VIEW  Comparison: 01/03/2013  Findings: Borderline cardiomegaly.  Atherosclerotic calcifications of thoracic aorta again noted.  No acute infiltrate or pulmonary edema.  Mild perihilar increased bronchial markings without focal consolidation.  IMPRESSION: No acute infiltrate or pulmonary edema.  Borderline cardiomegaly. Mild perihilar increased bronchial markings without focal consolidation.   Original Report Authenticated By: Natasha Mead, M.D.   1. Medication side effect, initial encounter     RA sat is 98% and I interpret to be normal  ECG at time 0841 shows SR at rate 64, normal axis, non specific T wave abn inferiorly.  Normal intervals.  Borderline ECG.  Minimal change from ECG on 01/03/13.   1:30 PM Pt has been more alert, staying awake for longer periods of time, no agitation in the ED.  Pt's family is here, affirm that pt seems to be mentating at baseline.  I think that pt's sedative medications being given at unusual times at night especially may be contributing to daytime somnolence.  No UTI, CXR shows no pneumonia, head CT shows no acute findings.  Family will discuss with pt's current PCP at her facility regarding her sedative use.    MDM  Pt is more drowsy, but once awake, conversant, no slurred speech.  Pt  did have a percocet around 0400.  Some of pt's somnolence likely medication related.  Rectal temp is normal.  Doesn't appear septic.  Possibly pt is mildly dehydration or has UTI.  She does not appear to be encephalopathic acutely.  No meningisumus on exam.    Gavin Pound. Jayde Mcallister, MD 04/25/13 1119

## 2013-04-24 NOTE — ED Notes (Signed)
Family at bedside, states that baseline for patient is alert and oriented to person and situation only.

## 2013-04-24 NOTE — ED Notes (Signed)
Patient drank entire cup of water without difficulty.

## 2013-04-24 NOTE — ED Notes (Signed)
MD at bedside. 

## 2013-04-24 NOTE — ED Notes (Signed)
Patient transported to CT 

## 2013-04-24 NOTE — ED Notes (Addendum)
Per EMS patient came from nursing home, with a baseline of A&O X 4 although patient has recent increase in memory loss. Per EMS, ambulance was called for lethargy and disorientation X 4. Per EMS patient does not wear O2 at home during the day, but wears 4L at night. Patient is arousable to voice and touch, able to verbalize incoherent words, able to hold head up.

## 2013-04-26 ENCOUNTER — Telehealth (HOSPITAL_COMMUNITY): Payer: Self-pay | Admitting: Emergency Medicine

## 2013-04-26 LAB — URINE CULTURE
Colony Count: >=100000
Special Requests: NORMAL

## 2013-04-26 NOTE — Progress Notes (Signed)
  ED Antimicrobial Stewardship Positive Culture Follow Up   [name] is an 79 y.o. female who presented to [facility] on 04/24/2013 with a chief complaint of:  Chief Complaint  Patient presents with  . Altered Mental Status    Recent Results (from the past 720 hour(s))  URINE CULTURE     Status: None   Collection Time    04/24/13  9:02 AM      Result Value Range Status   Specimen Description URINE, CATHETERIZED   Final   Special Requests Normal   Final   Culture  Setup Time 04/24/2013 10:43   Final   Colony Count >=100,000 COLONIES/ML   Final   Culture KLEBSIELLA PNEUMONIAE   Final   Report Status 04/26/2013 FINAL   Final   Organism ID, Bacteria KLEBSIELLA PNEUMONIAE   Final    []  Treated with , organism resistant to prescribed antimicrobial [x]  Patient discharged originally without antimicrobial agent and treatment is now indicated  Recommendation: Perform symptom check. If symptoms (altered mental status, abdominal pain, etc) persist, see new prescription below.   New antibiotic prescription: Keflex 500mg  PO BID x 7 days  ED Provider: Francee Piccolo, PA-C   Cleon Dew 04/26/2013, 5:07 PM Infectious Diseases Pharmacist Phone# [phone]

## 2013-04-26 NOTE — ED Notes (Signed)
Post ED Visit - Positive Culture Follow-up: Successful Patient Follow-Up  Culture assessed and recommendations reviewed by: [x]  Wes Dulaney, Pharm.D., BCPS []  Celedonio Miyamoto, Pharm.D., BCPS []  Georgina Pillion, Pharm.D., BCPS []  [place], [address].D., BCPS, AAHIVP []  Estella Husk, Pharm.D., BCPS, AAHIVP  Positive urine culture  [x]  Patient discharged without antimicrobial prescription and treatment is now indicated []  Organism is resistant to prescribed ED discharge antimicrobial []  Patient with positive blood cultures  Changes discussed with ED provider: Francee Piccolo PA-C New antibiotic prescription: If symptomatic, Keflex 500 mg PO BID x 7 days    Kylie A Holland 04/26/2013, 4:32 PM

## 2013-04-29 ENCOUNTER — Telehealth (HOSPITAL_COMMUNITY): Payer: Self-pay | Admitting: Emergency Medicine

## 2013-04-29 NOTE — Telephone Encounter (Signed)
Patient resident @ [address]. Notified Shareena at [facility] of + urine culture result and need for antibiotic. Result and med order faxed to [phone]

## 2013-05-02 ENCOUNTER — Emergency Department (HOSPITAL_COMMUNITY)
Admission: EM | Admit: 2013-05-02 | Discharge: 2013-05-02 | Disposition: A | Discharge status: Skilled Nursing Facility | Payer: Medicare Other | Attending: Emergency Medicine | Admitting: Emergency Medicine

## 2013-05-02 ENCOUNTER — Encounter (HOSPITAL_COMMUNITY): Payer: Self-pay | Admitting: Emergency Medicine

## 2013-05-02 ENCOUNTER — Emergency Department (HOSPITAL_COMMUNITY): Payer: Medicare Other

## 2013-05-02 DIAGNOSIS — W19XXXA Unspecified fall, initial encounter: Secondary | ICD-10-CM

## 2013-05-02 DIAGNOSIS — Y9389 Activity, other specified: Secondary | ICD-10-CM | POA: Insufficient documentation

## 2013-05-02 DIAGNOSIS — Z9181 History of falling: Secondary | ICD-10-CM | POA: Insufficient documentation

## 2013-05-02 DIAGNOSIS — Y9210 Unspecified residential institution as the place of occurrence of the external cause: Secondary | ICD-10-CM | POA: Insufficient documentation

## 2013-05-02 DIAGNOSIS — I509 Heart failure, unspecified: Secondary | ICD-10-CM | POA: Insufficient documentation

## 2013-05-02 DIAGNOSIS — I10 Essential (primary) hypertension: Secondary | ICD-10-CM | POA: Insufficient documentation

## 2013-05-02 DIAGNOSIS — M129 Arthropathy, unspecified: Secondary | ICD-10-CM | POA: Insufficient documentation

## 2013-05-02 DIAGNOSIS — W06XXXA Fall from bed, initial encounter: Secondary | ICD-10-CM | POA: Insufficient documentation

## 2013-05-02 DIAGNOSIS — S0100XA Unspecified open wound of scalp, initial encounter: Secondary | ICD-10-CM | POA: Insufficient documentation

## 2013-05-02 DIAGNOSIS — F0390 Unspecified dementia without behavioral disturbance: Secondary | ICD-10-CM | POA: Insufficient documentation

## 2013-05-02 DIAGNOSIS — Z792 Long term (current) use of antibiotics: Secondary | ICD-10-CM | POA: Insufficient documentation

## 2013-05-02 DIAGNOSIS — S0181XA Laceration without foreign body of other part of head, initial encounter: Secondary | ICD-10-CM

## 2013-05-02 DIAGNOSIS — E785 Hyperlipidemia, unspecified: Secondary | ICD-10-CM | POA: Insufficient documentation

## 2013-05-02 DIAGNOSIS — Z8739 Personal history of other diseases of the musculoskeletal system and connective tissue: Secondary | ICD-10-CM | POA: Insufficient documentation

## 2013-05-02 DIAGNOSIS — Z79899 Other long term (current) drug therapy: Secondary | ICD-10-CM | POA: Insufficient documentation

## 2013-05-02 NOTE — ED Notes (Signed)
Patient transported to CT 

## 2013-05-02 NOTE — ED Notes (Signed)
Spoke with [place], [place]- communicated that pt has a systolic BP in the 180s- PA stated that it is ok to send pt back to nursing home

## 2013-05-02 NOTE — ED Provider Notes (Signed)
History    CSN: [id number] Arrival date & time 05/02/13  0458  First MD Initiated Contact with Patient 05/02/13 [id number]     Chief Complaint  Patient presents with  . Fall   (Consider location/radiation/quality/duration/timing/severity/associated sxs/prior Treatment) HPI Comments: Patient is an 79 year old female with a past medical history of dementia and hypertension who presents from assisted living facility after an unwitnessed mechanical fall prior to arrival. Patient rolled over in bed and fell onto the floor, landing on her left side. Patient's son and daughter are at the bedside who provide the history. Patient has been experiencing multiple mechanical falls lately. Patient does not complain of pain. Patient's family states she has lacerations of her forehead and left elbow after the fall. Patient denies any other associated symptoms.    Past Medical History  Diagnosis Date  . Arthritis   . Hypertension   . Hyperlipidemia   . CHF (congestive heart failure)   . Cellulitis of right leg   . Cellulitis of left leg   . Esophageal reflux   . Abnormal gait   . Dementia    Past Surgical History  Procedure Laterality Date  . Joint replacement     History reviewed. No pertinent family history. History  Substance Use Topics  . Smoking status: Never Smoker   . Smokeless tobacco: Never Used  . Alcohol Use: No   OB History   Grav Para Term Preterm Abortions TAB SAB Ect Mult Living                 Review of Systems  Skin: Positive for wound.  All other systems reviewed and are negative.    Allergies  Review of patient's allergies indicates no known allergies.  Home Medications   Current Outpatient Rx  Name  Route  Sig  Dispense  Refill  . acetaminophen (TYLENOL) 500 MG tablet   Oral   Take 500 mg by mouth every 12 (twelve) hours. May take additional tablet every 4 hours if needed for pain         . brimonidine (ALPHAGAN P) 0.1 % SOLN   Right Eye   Place 1 drop  into the right eye 2 (two) times daily.         . calcium carbonate (TUMS - DOSED IN MG ELEMENTAL CALCIUM) 500 MG chewable tablet   Oral   Chew 2 tablets by mouth every morning.         . cephALEXin (KEFLEX) 500 MG capsule   Oral   Take 500 mg by mouth 2 (two) times daily.         . divalproex (DEPAKOTE SPRINKLE) 125 MG capsule   Oral   Take 125 mg by mouth 3 (three) times daily.         . enalapril (VASOTEC) 20 MG tablet   Oral   Take 20 mg by mouth every morning.         . famotidine (PEPCID) 20 MG tablet   Oral   Take 20 mg by mouth 2 (two) times daily.         . ferrous sulfate 325 (65 FE) MG tablet   Oral   Take 325 mg by mouth 2 (two) times daily.         . furosemide (LASIX) 40 MG tablet   Oral   Take 40 mg by mouth every morning.          . latanoprost (XALATAN) 0.005 % ophthalmic solution  Both Eyes   Place 1 drop into both eyes at bedtime.         Marland Kitchen LORazepam (ATIVAN) 0.5 MG tablet   Oral   Take 0.5 mg by mouth every 6 (six) hours as needed for anxiety (or agitation). Agitation         . Menthol, Topical Analgesic, (POLAR FROST) 4 % GEL   Apply externally   Apply 1 application topically 4 (four) times daily as needed (pain).         . metoprolol succinate (TOPROL-XL) 100 MG 24 hr tablet   Oral   Take 100 mg by mouth every morning. Take with or immediately following a meal.         . vitamin B-12 (CYANOCOBALAMIN) 1000 MCG tablet   Oral   Take 1,000 mcg by mouth every morning.           BP 162/80  Pulse 68  Temp(Src) 97.6 F (36.4 C) (Oral)  Resp 15  SpO2 100% Physical Exam  Nursing note and vitals reviewed. Constitutional: She is oriented to person, place, and time. She appears well-developed and well-nourished. No distress.  HENT:  Head: Normocephalic and atraumatic.  Eyes: Conjunctivae and EOM are normal. Pupils are equal, round, and reactive to light.  Neck: Normal range of motion.  Cardiovascular: Normal rate and  regular rhythm.  Exam reveals no gallop and no friction rub.   No murmur heard. Pulmonary/Chest: Effort normal and breath sounds normal. She has no wheezes. She has no rales. She exhibits no tenderness.  Abdominal: Soft. She exhibits no distension. There is no tenderness. There is no rebound and no guarding.  Musculoskeletal: Normal range of motion.  Neurological: She is alert and oriented to person, place, and time.  Strength and sensation equal and intact bilaterally. Speech is goal-oriented. Moves limbs without ataxia.   Skin: Skin is warm and dry.  1 cm laceration of central forehead with underlying hematoma that is tender to palpation. 1 cm laceration of left elbow. Bleeding controlled.   Psychiatric: She has a normal mood and affect. Her behavior is normal.    ED Course  Procedures (including critical care time)  LACERATION REPAIR Performed by: Emilia Beck Authorized by: Emilia Beck Consent: Verbal consent obtained. Risks and benefits: risks, benefits and alternatives were discussed Consent given by: patient Patient identity confirmed: provided demographic data Prepped and Draped in normal sterile fashion Wound explored  Laceration Location: forehead  Laceration Length: 1 cm  No Foreign Bodies seen or palpated  Anesthesia: none  Anesthetic total: 0 ml  Irrigation method: syringe Amount of cleaning: standard  Skin closure: dermabond  Number of sutures: n/a  Technique: n/a  Patient tolerance: Patient tolerated the procedure well with no immediate complications.   Labs Reviewed - No data to display Dg Elbow Complete Left  05/02/2013   *RADIOLOGY REPORT*  Clinical Data: Fall, skin tear to left elbow  LEFT ELBOW - COMPLETE 3+ VIEW  Comparison: None.  Findings: No fracture, dislocation, or joint effusion.  IMPRESSION: No acute findings   Original Report Authenticated By: Esperanza Heir, M.D.   Dg Elbow Complete Right  05/02/2013   *RADIOLOGY REPORT*   Clinical Data: Fall, limited range of motion right elbow and, family indicates the patient sustained prior right elbow injury with surgical repair  RIGHT ELBOW - COMPLETE 3+ VIEW  Comparison: 06/07/2012  Findings: Severe loss of joint space.  Abundant heterotopic ossification.  Radial head is absent.  No joint effusion or fracture.  No change from prior study.  IMPRESSION: Significant post-traumatic and postsurgical change with degenerative change.  No acute findings.   Original Report Authenticated By: Esperanza Heir, M.D.   Ct Head Wo Contrast  05/02/2013   *RADIOLOGY REPORT*  Clinical Data:  Larey Seat out of bed, left scalp hematoma  CT HEAD WITHOUT CONTRAST CT CERVICAL SPINE WITHOUT CONTRAST  Technique:  Multidetector CT imaging of the head and cervical spine was performed following the standard protocol without intravenous contrast.  Multiplanar CT image reconstructions of the cervical spine were also generated.  Comparison:  Most recent prior CT scan of the head 04/24/2013; most recent prior CT scan of the cervical spine 04/06/2013  CT HEAD  Findings: No acute intracranial hemorrhage, acute infarction, mass lesion, mass effect, midline shift or hydrocephalus.  Gray-white differentiation is preserved throughout.  Stable appearance of the brain with advanced cerebral, cerebellar and central atrophy with compensatory ex vacuo dilatation of the lateral ventricles. Periventricular, subcortical and white matter hypoattenuation is also similar and while nonspecific most suggestive of longstanding microvascular ischemia.  Focal soft tissue swelling overlying the left frontal bone consistent with scalp hematoma.  No evidence of underlying calvarial fracture.  The globes and orbits are intact and symmetric.  Extensive calcifications in the intracranial cavernous carotid arteries.  IMPRESSION:  1.  No acute intracranial abnormality. 2.  Left frontal scalp hematoma without underlying calvarial fracture. 3.  Stable advanced  atrophy, ventriculomegaly versus NPH and the sequela of longstanding microvascular ischemia.  CT CERVICAL SPINE  Findings: No acute fracture, malalignment or prevertebral soft tissue swelling. Degenerative changes at the atlantodental interval with a prominent subchondral cyst in the dens.  Cortex is very thin on either side of the cyst in the patient is likely at relatively higher risk for a dens fracture.  The dens remains intact on the present study.  Multilevel cervical spondylosis most significant at C4-C5 and C5-C6.  3 mm of degenerative anterolisthesis of C4 on C5 is stable compared to prior.  There is near total loss of disc space height at C5-C6 with bony ankylosis.  Multilevel bilateral facet arthropathy.  Bony fusion of the posterior elements at C5-C6.  Mildly heterogeneous thyroid gland with areas of calcification in the right thyroid lobe.  Appearance is suggestive of goiter.  Mild emphysematous changes in the visualized lung apices. Atherosclerotic calcification noted the bilateral carotid bifurcations.  No acute soft tissue abnormality.  IMPRESSION:  1.  No acute fracture or malalignment.  2.  Degenerative changes of the atlantodental interval include a large subchondral cyst in the dens with relative thinning of the cortex.  Although the dens is intact on the present study, the patient is likely a relatively high risk for fracture in the setting of future trauma.  Recommend aggressive fall precautions.  3.  Stable multilevel cervical spondylosis and facet arthropathy.  4. Additional ancillary findings as above without significant interval change.   Original Report Authenticated By: Malachy Moan, M.D.   Ct Cervical Spine Wo Contrast  05/02/2013   *RADIOLOGY REPORT*  Clinical Data:  Larey Seat out of bed, left scalp hematoma  CT HEAD WITHOUT CONTRAST CT CERVICAL SPINE WITHOUT CONTRAST  Technique:  Multidetector CT imaging of the head and cervical spine was performed following the standard protocol  without intravenous contrast.  Multiplanar CT image reconstructions of the cervical spine were also generated.  Comparison:  Most recent prior CT scan of the head 04/24/2013; most recent prior CT scan of the cervical spine 04/06/2013  CT HEAD  Findings: No acute intracranial hemorrhage, acute infarction, mass lesion, mass effect, midline shift or hydrocephalus.  Gray-white differentiation is preserved throughout.  Stable appearance of the brain with advanced cerebral, cerebellar and central atrophy with compensatory ex vacuo dilatation of the lateral ventricles. Periventricular, subcortical and white matter hypoattenuation is also similar and while nonspecific most suggestive of longstanding microvascular ischemia.  Focal soft tissue swelling overlying the left frontal bone consistent with scalp hematoma.  No evidence of underlying calvarial fracture.  The globes and orbits are intact and symmetric.  Extensive calcifications in the intracranial cavernous carotid arteries.  IMPRESSION:  1.  No acute intracranial abnormality. 2.  Left frontal scalp hematoma without underlying calvarial fracture. 3.  Stable advanced atrophy, ventriculomegaly versus NPH and the sequela of longstanding microvascular ischemia.  CT CERVICAL SPINE  Findings: No acute fracture, malalignment or prevertebral soft tissue swelling. Degenerative changes at the atlantodental interval with a prominent subchondral cyst in the dens.  Cortex is very thin on either side of the cyst in the patient is likely at relatively higher risk for a dens fracture.  The dens remains intact on the present study.  Multilevel cervical spondylosis most significant at C4-C5 and C5-C6.  3 mm of degenerative anterolisthesis of C4 on C5 is stable compared to prior.  There is near total loss of disc space height at C5-C6 with bony ankylosis.  Multilevel bilateral facet arthropathy.  Bony fusion of the posterior elements at C5-C6.  Mildly heterogeneous thyroid gland with  areas of calcification in the right thyroid lobe.  Appearance is suggestive of goiter.  Mild emphysematous changes in the visualized lung apices. Atherosclerotic calcification noted the bilateral carotid bifurcations.  No acute soft tissue abnormality.  IMPRESSION:  1.  No acute fracture or malalignment.  2.  Degenerative changes of the atlantodental interval include a large subchondral cyst in the dens with relative thinning of the cortex.  Although the dens is intact on the present study, the patient is likely a relatively high risk for fracture in the setting of future trauma.  Recommend aggressive fall precautions.  3.  Stable multilevel cervical spondylosis and facet arthropathy.  4. Additional ancillary findings as above without significant interval change.   Original Report Authenticated By: Malachy Moan, M.D.   1. Fall, initial encounter   2. Forehead laceration, initial encounter     MDM  6:11 AM CT head and cervical spine pending. Bilateral elbow xrays pending.    7:40 AM Xrays and CT head and cervical spine unremarkable for acute changes. Lacerations repaired without difficulty. Bandage applied to skin tear of left elbow. Patient will be sent back to [facility]. Vitals stable and patient afebrile.   Emilia Beck, PA-C 05/02/13 1526

## 2013-05-02 NOTE — ED Notes (Signed)
Called [facility]- we are 1st on the list for [facility] pick up.

## 2013-05-02 NOTE — ED Notes (Addendum)
Per EMS: Facility states pt rolled out of bed and sustained a skin tear to forehead above left eye and left forearm. Pt is alert and oriented. Pt has hematoma to left side of forehead.

## 2013-05-02 NOTE — ED Notes (Signed)
[id number]<AV> Expected date:<BR> Expected time:<BR> Means of arrival:<BR> Comments:<BR> EMS, fall, skin tear to forehead and elbow

## 2013-05-02 NOTE — ED Notes (Addendum)
Spoke with Patty, Consulting civil engineer; per instruction, I will d/c this pt after taking another pt up to floor.  Explained to family that there would be a slight delay and they were okay with that.

## 2013-05-03 NOTE — ED Provider Notes (Signed)
Medical screening examination/treatment/procedure(s) were performed by non-physician practitioner and as supervising physician I was immediately available for consultation/collaboration.   Rachell Druckenmiller L Kirstein Baxley, MD 05/03/13 0845 

## 2013-06-05 ENCOUNTER — Non-Acute Institutional Stay (SKILLED_NURSING_FACILITY): Payer: Medicare Other | Admitting: Internal Medicine

## 2013-06-05 ENCOUNTER — Encounter: Payer: Self-pay | Admitting: Internal Medicine

## 2013-06-05 DIAGNOSIS — M1990 Unspecified osteoarthritis, unspecified site: Secondary | ICD-10-CM

## 2013-06-05 DIAGNOSIS — I10 Essential (primary) hypertension: Secondary | ICD-10-CM

## 2013-06-05 DIAGNOSIS — F0391 Unspecified dementia with behavioral disturbance: Secondary | ICD-10-CM | POA: Insufficient documentation

## 2013-06-05 DIAGNOSIS — R609 Edema, unspecified: Secondary | ICD-10-CM

## 2013-06-05 DIAGNOSIS — F0390 Unspecified dementia without behavioral disturbance: Secondary | ICD-10-CM

## 2013-06-05 DIAGNOSIS — F068 Other specified mental disorders due to known physiological condition: Secondary | ICD-10-CM

## 2013-06-05 DIAGNOSIS — E785 Hyperlipidemia, unspecified: Secondary | ICD-10-CM

## 2013-06-05 DIAGNOSIS — H906 Mixed conductive and sensorineural hearing loss, bilateral: Secondary | ICD-10-CM

## 2013-06-05 DIAGNOSIS — D649 Anemia, unspecified: Secondary | ICD-10-CM

## 2013-06-05 NOTE — Assessment & Plan Note (Signed)
On no meds-a year ago lipid profile was fine

## 2013-06-05 NOTE — Assessment & Plan Note (Addendum)
Had a 30 question written assessment that showed mild-mod demntia;daughter says sed short trem memory;spoke with daughter, willing to try aricept 5 mg q HS to inc in 6 weeks to 10 mg qHS; pt also on depakote sprinkles for agitation

## 2013-06-05 NOTE — Assessment & Plan Note (Signed)
Stable-pt on iron and B12

## 2013-06-05 NOTE — Progress Notes (Signed)
MRN: [id number] Name: [name]  Sex: female Age: 79 y.o. DOB: 1925-08-17  PSC #: [facility] Facility/Room: 118 Level Of Care: SNF Provider: Merrilee Seashore D Emergency Contacts: Extended Emergency Contact Information Primary Emergency Contact: [facility] Address: [address]          [place], [place] [address] [place] Home Phone: [phone] Mobile Phone: [phone] Relation: Daughter Secondary Emergency Contact: Sarria,Joseph Address: [address]           [place], [place] [address] [place] of [place] Home Phone: [phone] Mobile Phone: [phone] Relation: Son  Code Status:   Allergies: Review of patient's allergies indicates no known allergies.  Chief Complaint  Patient presents with  . nursing home admission    HPI: Patient is 79 y.o. female who has gone from retirement home living , to memory care living and now to SNF in one year. She has dementia and ambulation problems secondary to pain in knees from arthritis.She is here with a daughter and a son.  Past Medical History  Diagnosis Date  . Arthritis   . Hypertension   . Hyperlipidemia   . CHF (congestive heart failure)   . Cellulitis of right leg   . Cellulitis of left leg   . Esophageal reflux   . Abnormal gait   . Dementia     Past Surgical History  Procedure Laterality Date  . Joint replacement        Medication List       This list is accurate as of: 06/05/13 11:59 PM.  Always use your most recent med list.               acetaminophen 500 MG tablet  Commonly known as:  TYLENOL  Take 500 mg by mouth every 12 (twelve) hours. May take additional tablet every 4 hours if needed for pain     brimonidine 0.1 % Soln  Commonly known as:  ALPHAGAN P  Place 1 drop into the right eye 2 (two) times daily.     calcium carbonate 500 MG chewable tablet  Commonly known as:  TUMS - dosed in mg elemental calcium  Chew 2 tablets by mouth every morning.     cholecalciferol  1000 UNITS tablet  Commonly known as:  VITAMIN D  Take 1,000 Units by mouth daily.     divalproex 125 MG capsule  Commonly known as:  DEPAKOTE SPRINKLE  Take 125 mg by mouth 3 (three) times daily.     enalapril 20 MG tablet  Commonly known as:  VASOTEC  Take 20 mg by mouth every morning.     famotidine 20 MG tablet  Commonly known as:  PEPCID  Take 20 mg by mouth 2 (two) times daily.     ferrous sulfate 325 (65 FE) MG tablet  Take 325 mg by mouth 2 (two) times daily.     furosemide 40 MG tablet  Commonly known as:  LASIX  Take 40 mg by mouth 2 (two) times daily.     latanoprost 0.005 % ophthalmic solution  Commonly known as:  XALATAN  Place 1 drop into both eyes at bedtime.     LORazepam 0.5 MG tablet  Commonly known as:  ATIVAN  Take 0.5 mg by mouth every 6 (six) hours as needed for anxiety (or agitation). Agitation     metoprolol succinate 100 MG 24 hr tablet  Commonly known as:  TOPROL-XL  Take 100 mg by mouth every morning. Take with  or immediately following a meal.     vitamin B-12 1000 MCG tablet  Commonly known as:  CYANOCOBALAMIN  Take 1,000 mcg by mouth daily.        Meds ordered this encounter  Medications  . vitamin B-12 (CYANOCOBALAMIN) 1000 MCG tablet    Sig: Take 1,000 mcg by mouth daily.  . cholecalciferol (VITAMIN D) 1000 UNITS tablet    Sig: Take 1,000 Units by mouth daily.    Immunization History  Administered Date(s) Administered  . Influenza Whole 07/15/2001, 09/03/2007, 07/16/2009  . Pneumococcal Polysaccharide 12/15/2007  . Td 12/18/2007    History  Substance Use Topics  . Smoking status: Never Smoker   . Smokeless tobacco: Never Used  . Alcohol Use: No    Family history is noncontributory    Review of Systems  DATA OBTAINED: from patient,  family members GENERAL: Feels well no fevers, fatigue, appetite changes SKIN: No itching, rash; multiple recurent problems with cellulitis BLE; legs wrapped now and thast works better than  compression stockings EYES: No eye pain, redness, discharge EARS: No earache, tinnitus, change in hearing NOSE: No congestion, drainage or bleeding  MOUTH/THROAT: No mouth or tooth pain, No sore throat, No difficulty chewing or swallowing  RESPIRATORY: No cough, wheezing, SOB CARDIAC: No chest pain, palpitations, keeps  extremity edema which improves some with leg elevation GI: No abdominal pain, No N/V/D or constipation, No heartburn or reflux  GU: No dysuria, frequency or urgency, or incontinence  MUSCULOSKELETAL: No unrelieved bone/joint pain NEUROLOGIC: Awake, alert, appropriate to situation, No change in mental status. Moves all four, no focal deficits PSYCHIATRIC: No overt anxiety or sadness. Sleeps well. No behavior issue.  AMBULATION:  Needs lift to get to bathroom  Filed Vitals:   06/05/13 1621  BP: 121/73  Pulse: 67  Temp: 97.9 F (36.6 C)  Resp: 22    Physical Exam  GENERAL APPEARANCE: Alert,  minimallyconversant. Appropriately groomed. No acute distress.  SKIN: No diaphoresis rash, HEAD: Normocephalic, atraumatic  EYES: Conjunctiva/lids clear. Pupils round, reactive. EOMs intact.  EARS: External exam WNL, canals clear. Hearing grossly normal.  NOSE: No deformity or discharge.  MOUTH/THROAT: Lips w/o lesions.  RESPIRATORY: Breathing is even, unlabored. Lung sounds are clear   CARDIOVASCULAR: Heart RRR no murmurs, rubs or gallops. mild peripheral edema., both legs in ace wraps  ARTERIAL: radial pulse 2   GASTROINTESTINAL: Abdomen is soft, non-tender, not distended w/ normal bowel sounds GENITOURINARY: Bladder non tender, not distended  MUSCULOSKELETAL: No abnormal joints or musculature NEUROLOGIC: Oriented X1. Cranial nerves 2-12 grossly intact. Moves all extremities no tremor. PSYCHIATRIC: Dementia affect, no behavioral issues  Patient Active Problem List   Diagnosis Date Noted  . Dementia arising in the senium and presenium 06/05/2013  . ARF (acute renal  failure) 06/09/2012  . Hyponatremia 12/20/2011  . Anemia 12/20/2011  . Cellulitis 12/20/2011  . Pedal edema 12/20/2011  . Venous stasis dermatitis 12/20/2011  . Syncope 12/19/2011  . TIA (transient ischemic attack) 12/19/2011  . CONGESTIVE HEART FAILURE 07/25/2010  . VITAMIN B12 DEFICIENCY 04/27/2010  . MIXED HEARING LOSS BILATERAL 01/17/2009  . ANEMIA-NOS 12/14/2008  . CONJUNCTIVITIS, ACUTE, BILATERAL 12/14/2008  . SINUSITIS- ACUTE-NOS 12/14/2008  . LEG CRAMPS 12/14/2008  . URINARY INCONTINENCE, MILD 12/20/2007  . SHOULDER PAIN 09/03/2007  . HYPERLIPIDEMIA 08/14/2007  . HYPERTENSION 08/14/2007  . OSTEOARTHRITIS 08/14/2007  . PERIPHERAL EDEMA 08/14/2007      CBC    Component Value Date/Time   WBC 7.9 04/24/2013 0924   RBC  3.38* 04/24/2013 0924   RBC 3.01* 12/20/2011 0340   HGB 10.9* 04/24/2013 0924   HCT 32.2* 04/24/2013 0924   PLT 308 04/24/2013 0924   MCV 95.3 04/24/2013 0924   LYMPHSABS 1.7 04/24/2013 0924   MONOABS 0.7 04/24/2013 0924   EOSABS 0.2 04/24/2013 0924   BASOSABS 0.0 04/24/2013 0924    CMP     Component Value Date/Time   NA 134* 04/24/2013 0924   K 4.6 04/24/2013 0924   CL 96 04/24/2013 0924   CO2 28 04/24/2013 0924   GLUCOSE 106* 04/24/2013 0924   BUN 34* 04/24/2013 0924   CREATININE 1.55* 04/24/2013 0924   CALCIUM 9.4 04/24/2013 0924   PROT 7.0 04/24/2013 0924   ALBUMIN 3.3* 04/24/2013 0924   AST 16 04/24/2013 0924   ALT 8 04/24/2013 0924   ALKPHOS 78 04/24/2013 0924   BILITOT 0.3 04/24/2013 0924   GFRNONAA 29* 04/24/2013 0924   GFRAA 34* 04/24/2013 0924    Assessment and Plan  Dementia arising in the senium and presenium Had a 30 question written assessment that showed mild-mod demntia;daughter says sed short trem memory;spoke with daughter, willing to try aricept 5 mg q HS to inc in 6 weeks to 10 mg qHS; pt also on depakote sprinkles for agitation  HYPERTENSION Will continue present meds  OSTEOARTHRITIS Really bad pain both knees but pt does not take any  chronic meds for it and family wANTS HER TO USE pain meds prn  ANEMIA-NOS Stable-pt on iron and B12  HYPERLIPIDEMIA On no meds-a year ago lipid profile was fine  PERIPHERAL EDEMA BLE edema with tendency to cellulitis;ace compression wraps weekly to include ankles/feet    Margit Hanks, MD

## 2013-06-05 NOTE — Assessment & Plan Note (Signed)
Will continue present meds

## 2013-06-05 NOTE — Assessment & Plan Note (Signed)
Really bad pain both knees but pt does not take any chronic meds for it and family wANTS HER TO USE pain meds prn

## 2013-06-05 NOTE — Assessment & Plan Note (Signed)
BLE edema with tendency to cellulitis;ace compression wraps weekly to include ankles/feet

## 2013-06-08 ENCOUNTER — Other Ambulatory Visit: Payer: Self-pay

## 2013-06-08 MED ORDER — LORAZEPAM 0.5 MG PO TABS: 0.5000 mg | ORAL_TABLET | Freq: Four times a day (QID) | ORAL | Status: DC | PRN

## 2013-06-08 NOTE — Assessment & Plan Note (Signed)
Hears best out of R ear so speak to her R side

## 2013-06-26 ENCOUNTER — Encounter: Payer: Self-pay | Admitting: Internal Medicine

## 2013-07-08 ENCOUNTER — Encounter: Payer: Self-pay | Admitting: Internal Medicine

## 2013-07-08 ENCOUNTER — Non-Acute Institutional Stay (SKILLED_NURSING_FACILITY): Payer: Medicare Other | Admitting: Internal Medicine

## 2013-07-08 DIAGNOSIS — F068 Other specified mental disorders due to known physiological condition: Secondary | ICD-10-CM

## 2013-07-08 DIAGNOSIS — I509 Heart failure, unspecified: Secondary | ICD-10-CM

## 2013-07-08 DIAGNOSIS — R609 Edema, unspecified: Secondary | ICD-10-CM

## 2013-07-08 DIAGNOSIS — N179 Acute kidney failure, unspecified: Secondary | ICD-10-CM

## 2013-07-08 DIAGNOSIS — F0390 Unspecified dementia without behavioral disturbance: Secondary | ICD-10-CM

## 2013-07-08 DIAGNOSIS — D649 Anemia, unspecified: Secondary | ICD-10-CM

## 2013-07-08 DIAGNOSIS — E538 Deficiency of other specified B group vitamins: Secondary | ICD-10-CM

## 2013-07-08 NOTE — Assessment & Plan Note (Signed)
Pt was started on aricept on arrival, this is the first time she has been on it; I have noted that patient is so sleepy all the time;plan d/c aricept as the possible cause of her somnalence

## 2013-07-08 NOTE — Assessment & Plan Note (Signed)
Will recheck level

## 2013-07-08 NOTE — Assessment & Plan Note (Signed)
Much improved with compression stockings

## 2013-07-08 NOTE — Assessment & Plan Note (Signed)
Will recheck CBC and see if it is at baseline

## 2013-07-08 NOTE — Progress Notes (Signed)
MRN: [id number] Name: [name]  Sex: female Age: 79 y.o. DOB: 06/05/1925  PSC #: Ronni Rumble Facility/Room: 118 Level Of Care: SNF Provider: Merrilee Seashore D Emergency Contacts: Extended Emergency Contact Information Primary Emergency Contact: [facility] Address: [address]          [place], [place] [address] Darden Amber of [place] Home Phone: [phone] Mobile Phone: [phone] Relation: Daughter Secondary Emergency Contact: Quraishi,Joseph Address: [address]           [place], [place] [address] Darden [place] of [place] Home Phone: [phone] Mobile Phone: [phone] Relation: Son  Code Status: DNR  Allergies: Review of patient's allergies indicates no known allergies.  Chief Complaint  Patient presents with  . Medical Managment of Chronic Issues    HPI: Patient is 79 y.o. female who is being seen today for some chronic issues as well as changes in BUN/Cr.  Past Medical History  Diagnosis Date  . Arthritis   . Hypertension   . Hyperlipidemia   . CHF (congestive heart failure)   . Cellulitis of right leg   . Cellulitis of left leg   . Esophageal reflux   . Abnormal gait   . Dementia     Past Surgical History  Procedure Laterality Date  . Joint replacement        Medication List       This list is accurate as of: 07/08/13 11:48 AM.  Always use your most recent med list.               acetaminophen 500 MG tablet  Commonly known as:  TYLENOL  Take 500 mg by mouth every 12 (twelve) hours. May take additional tablet every 4 hours if needed for pain     brimonidine 0.1 % Soln  Commonly known as:  ALPHAGAN P  Place 1 drop into the right eye 2 (two) times daily.     calcium carbonate 500 MG chewable tablet  Commonly known as:  TUMS - dosed in mg elemental calcium  Chew 2 tablets by mouth every morning.     cholecalciferol 1000 UNITS tablet  Commonly known as:  VITAMIN D  Take 1,000 Units by mouth daily.     divalproex 125 MG capsule   Commonly known as:  DEPAKOTE SPRINKLE  Take 125 mg by mouth 3 (three) times daily.     donepezil 10 MG tablet  Commonly known as:  ARICEPT  Take 10 mg by mouth at bedtime as needed.     enalapril 20 MG tablet  Commonly known as:  VASOTEC  Take 20 mg by mouth every morning.     famotidine 20 MG tablet  Commonly known as:  PEPCID  Take 20 mg by mouth 2 (two) times daily.     ferrous sulfate 325 (65 FE) MG tablet  Take 325 mg by mouth 2 (two) times daily.     furosemide 40 MG tablet  Commonly known as:  LASIX  Take 40 mg by mouth 2 (two) times daily.     latanoprost 0.005 % ophthalmic solution  Commonly known as:  XALATAN  Place 1 drop into both eyes at bedtime.     LORazepam 0.5 MG tablet  Commonly known as:  ATIVAN  Take 1 tablet (0.5 mg total) by mouth every 6 (six) hours as needed for anxiety (or agitation). Agitation     metoprolol succinate 100 MG 24 hr tablet  Commonly known as:  TOPROL-XL  Take 100 mg by mouth  every morning. Take with or immediately following a meal.     vitamin B-12 1000 MCG tablet  Commonly known as:  CYANOCOBALAMIN  Take 1,000 mcg by mouth daily.        Meds ordered this encounter  Medications  . donepezil (ARICEPT) 10 MG tablet    Sig: Take 10 mg by mouth at bedtime as needed.    Immunization History  Administered Date(s) Administered  . Influenza Whole 07/15/2001, 09/03/2007, 07/16/2009  . Pneumococcal Polysaccharide 12/15/2007  . Td 12/18/2007    History  Substance Use Topics  . Smoking status: Never Smoker   . Smokeless tobacco: Never Used  . Alcohol Use: No    Family history is noncontributory    Review of Systems  DATA OBTAINED: from patient, GENERAL: Feels well no fevers,  appetite changes SKIN: No itching, rash or wounds EYES: No eye pain, redness, discharge EARS: No earache, tinnitus, change in hearing NOSE: No congestion, drainage or bleeding  MOUTH/THROAT: No mouth or tooth pain, No sore throat, No difficulty  chewing or swallowing  RESPIRATORY: No cough, wheezing, SOB CARDIAC: No chest pain, palpitations, lower extremity edema  GI: No abdominal pain, No N/V/D or constipation, No heartburn or reflux  GU: No dysuria, frequency or urgency, or incontinence  MUSCULOSKELETAL: No unrelieved bone/joint pain NEUROLOGIC: Awake, alert, appropriate to situation, No change in mental status. Moves all four, no focal deficits PSYCHIATRIC: No overt anxiety or sadness. Sleeps well. No behavior issue.    Filed Vitals:   07/08/13 1114  BP: 112/62  Pulse: 67  Temp: 98.6 F (37 C)  Resp: 20    Physical Exam  GENERAL APPEARANCE:  Appropriately groomed. No acute distress; SOMNALENT, answers appropriately then closes eyes again SKIN: No diaphoresis rash, or wounds HEENT- unremarkable RESPIRATORY: Breathing is even, unlabored. Lung sounds are clear   CARDIOVASCULAR: Heart RRR no murmurs, rubs or gallops. No peripheral edema.  GASTROINTESTINAL: Abdomen is soft, non-tender, not distended w/ normal bowel sounds.  GENITOURINARY: Bladder non tender, not distended  MUSCULOSKELETAL: No abnormal joints or musculature NEUROLOGIC: Oriented X1. Cranial nerves 2-12 grossly intact, no focal weakness  Patient Active Problem List   Diagnosis Date Noted  . Dementia arising in the senium and presenium 06/05/2013  . ARF (acute renal failure) 06/09/2012  . Hyponatremia 12/20/2011  . Anemia 12/20/2011  . Cellulitis 12/20/2011  . Pedal edema 12/20/2011  . Venous stasis dermatitis 12/20/2011  . Syncope 12/19/2011  . TIA (transient ischemic attack) 12/19/2011  . CONGESTIVE HEART FAILURE 07/25/2010  . VITAMIN B12 DEFICIENCY 04/27/2010  . MIXED HEARING LOSS BILATERAL 01/17/2009  . ANEMIA-NOS 12/14/2008  . CONJUNCTIVITIS, ACUTE, BILATERAL 12/14/2008  . SINUSITIS- ACUTE-NOS 12/14/2008  . LEG CRAMPS 12/14/2008  . URINARY INCONTINENCE, MILD 12/20/2007  . SHOULDER PAIN 09/03/2007  . HYPERLIPIDEMIA 08/14/2007  . HYPERTENSION  08/14/2007  . OSTEOARTHRITIS 08/14/2007  . PERIPHERAL EDEMA 08/14/2007    CBC    Component Value Date/Time   WBC 7.9 04/24/2013 0924   RBC 3.38* 04/24/2013 0924   RBC 3.01* 12/20/2011 0340   HGB 10.9* 04/24/2013 0924   HCT 32.2* 04/24/2013 0924   PLT 308 04/24/2013 0924   MCV 95.3 04/24/2013 0924   LYMPHSABS 1.7 04/24/2013 0924   MONOABS 0.7 04/24/2013 0924   EOSABS 0.2 04/24/2013 0924   BASOSABS 0.0 04/24/2013 0924    CMP     Component Value Date/Time   NA 134* 04/24/2013 0924   K 4.6 04/24/2013 0924   CL 96 04/24/2013 0924  CO2 28 04/24/2013 0924   GLUCOSE 106* 04/24/2013 0924   BUN 34* 04/24/2013 0924   CREATININE 1.55* 04/24/2013 0924   CALCIUM 9.4 04/24/2013 0924   PROT 7.0 04/24/2013 0924   ALBUMIN 3.3* 04/24/2013 0924   AST 16 04/24/2013 0924   ALT 8 04/24/2013 0924   ALKPHOS 78 04/24/2013 0924   BILITOT 0.3 04/24/2013 0924   GFRNONAA 29* 04/24/2013 0924   GFRAA 34* 04/24/2013 0924    Assessment and Plan  CONGESTIVE HEART FAILURE Pt has had no c/o SOB;leg edema improved with TED hose;BP and HR are good; Do plan to decrease Lasix 40 mg BID to daily because of rising BUN/Cr  PERIPHERAL EDEMA Much improved with compression stockings  ARF (acute renal failure) Pt's BUN/Cr 9/15 was 57/1.96 ;9/11 63/2.49 and lasix was stopped for 3 days and it did improve;however in 10/2012 it was 32/1.87 SO-plan to decrease lasix 40 mg BID to just once a day in the am and repeat  BMP in a week.  Dementia arising in the senium and presenium Pt was started on aricept on arrival, this is the first time she has been on it; I have noted that patient is so sleepy all the time;plan d/c aricept as the possible cause of her somnalence  VITAMIN B12 DEFICIENCY Will recheck level  PT IS ON VITAMIN D DAILY- WILL CHECK LEVEL  Margit Hanks, MD

## 2013-07-08 NOTE — Progress Notes (Signed)
This encounter was created in error - please disregard.

## 2013-07-08 NOTE — Assessment & Plan Note (Addendum)
Pt's BUN/Cr 9/15 was 57/1.96 ;9/11 63/2.49 and lasix was stopped for 3 days and it did improve;however in 04/2013 it was 34/1.55  SO-plan to decrease lasix 40 mg BID to just once a day in the am and repeat  BMP in a week.

## 2013-07-08 NOTE — Assessment & Plan Note (Signed)
Pt has had no c/o SOB;leg edema improved with TED hose;BP and HR are good; Do plan to decrease Lasix 40 mg BID to daily because of rising BUN/Cr

## 2013-08-14 ENCOUNTER — Non-Acute Institutional Stay (SKILLED_NURSING_FACILITY): Payer: Medicare Other | Admitting: Nurse Practitioner

## 2013-08-14 DIAGNOSIS — E538 Deficiency of other specified B group vitamins: Secondary | ICD-10-CM

## 2013-08-14 DIAGNOSIS — I10 Essential (primary) hypertension: Secondary | ICD-10-CM

## 2013-08-14 DIAGNOSIS — I509 Heart failure, unspecified: Secondary | ICD-10-CM

## 2013-08-14 DIAGNOSIS — R609 Edema, unspecified: Secondary | ICD-10-CM

## 2013-08-14 DIAGNOSIS — D649 Anemia, unspecified: Secondary | ICD-10-CM

## 2013-08-14 DIAGNOSIS — F068 Other specified mental disorders due to known physiological condition: Secondary | ICD-10-CM

## 2013-08-14 DIAGNOSIS — F0390 Unspecified dementia without behavioral disturbance: Secondary | ICD-10-CM

## 2013-08-14 NOTE — Progress Notes (Signed)
Patient ID: [name], female   DOB: [birth year], 79 y.o.   MRN: [id number] Nursing Home Location:  [facility]   Place of Service: SNF (31)   No Known Allergies  Chief Complaint  Patient presents with  . Medical Managment of Chronic Issues    HPI:  Patient is 79 y.o. female who has gone from retirement home living, to memory care living and now to SNF. She is a long term resident of [facility]. Pt with a pmh of dementia, arthritis, hypertension, hyperlipidemia, CHF, edema, who is being seen today for routine follow up. pts daughter with pt at time of visit. No concerns per nursing and no complaints by pt or daughter.   Review of Systems:  Review of Systems  Constitutional: Negative for fever and chills.  Respiratory: Negative for shortness of breath.   Cardiovascular: Positive for leg swelling. Negative for chest pain.  Gastrointestinal: Negative for abdominal pain and constipation.  Genitourinary: Negative for dysuria.  Musculoskeletal: Negative for joint pain and myalgias.  Skin: Negative.   Psychiatric/Behavioral: Positive for memory loss. Negative for depression.     Past Medical History  Diagnosis Date  . Arthritis   . Hypertension   . Hyperlipidemia   . CHF (congestive heart failure)   . Cellulitis of right leg   . Cellulitis of left leg   . Esophageal reflux   . Abnormal gait   . Dementia    Past Surgical History  Procedure Laterality Date  . Joint replacement     Social History:   reports that she has never smoked. She has never used smokeless tobacco. She reports that she does not drink alcohol or use illicit drugs.  No family history on file.  Medications: Patient's Medications  New Prescriptions   No medications on file  Previous Medications   ACETAMINOPHEN (TYLENOL) 500 MG TABLET    Take 500 mg by mouth every 12 (twelve) hours. May take additional tablet every 4 hours if needed for pain   BRIMONIDINE (ALPHAGAN P) 0.1 % SOLN     Place 1 drop into the right eye 2 (two) times daily.   CALCIUM CARBONATE (TUMS - DOSED IN MG ELEMENTAL CALCIUM) 500 MG CHEWABLE TABLET    Chew 2 tablets by mouth every morning.   CHOLECALCIFEROL (VITAMIN D) 1000 UNITS TABLET    Take 1,000 Units by mouth daily.   DIVALPROEX (DEPAKOTE SPRINKLE) 125 MG CAPSULE    Take 125 mg by mouth 3 (three) times daily.   ENALAPRIL (VASOTEC) 20 MG TABLET    Take 20 mg by mouth every morning.   FAMOTIDINE (PEPCID) 20 MG TABLET    Take 20 mg by mouth 2 (two) times daily.   FERROUS SULFATE 325 (65 FE) MG TABLET    Take 325 mg by mouth 2 (two) times daily.   FUROSEMIDE (LASIX) 40 MG TABLET    Take 40 mg by mouth daily.    LATANOPROST (XALATAN) 0.005 % OPHTHALMIC SOLUTION    Place 1 drop into both eyes at bedtime.   LORAZEPAM (ATIVAN) 0.5 MG TABLET    Take 1 tablet (0.5 mg total) by mouth every 6 (six) hours as needed for anxiety (or agitation). Agitation   METOPROLOL SUCCINATE (TOPROL-XL) 100 MG 24 HR TABLET    Take 100 mg by mouth every morning. Take with or immediately following a meal.   VITAMIN B-12 (CYANOCOBALAMIN) 1000 MCG TABLET    Take 1,000 mcg by mouth daily.  Modified Medications  No medications on file  Discontinued Medications   DONEPEZIL (ARICEPT) 10 MG TABLET    Take 10 mg by mouth at bedtime as needed.     Physical Exam:  Filed Vitals:   08/14/13 1548  BP: 138/81  Pulse: 69  Temp: 98 F (36.7 C)  Resp: 20   Physical Exam  Vitals reviewed. Constitutional: She is well-developed, well-nourished, and in no distress. No distress.  HENT:  Head: Normocephalic and atraumatic.  Mouth/Throat: Oropharynx is clear and moist. No oropharyngeal exudate.  Eyes: Conjunctivae and EOM are normal. Pupils are equal, round, and reactive to light.  Neck: Normal range of motion. Neck supple.  Cardiovascular: Normal rate, regular rhythm and normal heart sounds.   Pulmonary/Chest: Effort normal and breath sounds normal. No respiratory distress.  Abdominal:  Soft. Bowel sounds are normal. She exhibits no distension. There is no tenderness.  Musculoskeletal: She exhibits edema (2+ to lower ext). She exhibits no tenderness.  Neurological: She is alert.  Skin: Skin is warm and dry. She is not diaphoretic.      Labs reviewed: Basic Metabolic Panel:  Recent Labs  40/98/11 1150 04/24/13 0924  NA 138 134*  K 4.0 4.6  CL 100 96  CO2 27 28  GLUCOSE 151* 106*  BUN 40* 34*  CREATININE 1.79* 1.55*  CALCIUM 9.6 9.4   Liver Function Tests:  Recent Labs  04/24/13 0924  AST 16  ALT 8  ALKPHOS 78  BILITOT 0.3  PROT 7.0  ALBUMIN 3.3*   No results found for this basename: LIPASE, AMYLASE,  in the last 8760 hours No results found for this basename: AMMONIA,  in the last 8760 hours CBC:  Recent Labs  01/03/13 1150 04/24/13 0924  WBC 9.5 7.9  NEUTROABS 6.8 5.4  HGB 9.6* 10.9*  HCT 28.5* 32.2*  MCV 95.6 95.3  PLT 329 308   Basic Metabolic Panel    Result: 07/15/2013 10:38 AM   ( Status: F )     C Sodium 138     135-145 mEq/L SLN   Potassium 4.9     3.5-5.3 mEq/L SLN   Chloride 101     96-112 mEq/L SLN   CO2 31     19-32 mEq/L SLN   Glucose 77     70-99 mg/dL SLN   BUN 60   H 9-14 mg/dL SLN   Creatinine 7.82   H 0.50-1.10 mg/dL SLN   Calcium 8.8     9.5-62.1 mg/dL SLN   Basic Metabolic Panel    Result: 07/09/2013 4:01 PM   ( Status: F )     C Sodium 138     135-145 mEq/L SLN   Potassium 4.4     3.5-5.3 mEq/L SLN   Chloride 100     96-112 mEq/L SLN   CO2 33   H 19-32 mEq/L SLN   Glucose 96     70-99 mg/dL SLN   BUN 69   H 3-08 mg/dL SLN   Creatinine 6.57   H 0.50-1.10 mg/dL SLN   Calcium 9.0     8.4-69.6 mg/dL SLN   Vitamin E95    Result: 07/09/2013 3:45 PM   ( Status: F )       Vitamin B12 1486   H 211-911 pg/mL SLN   Vitamin D (25-Hydroxy)    Result: 07/10/2013 2:37 AM   ( Status: F )       Vitamin D (25-Hydroxy) 59 Basic Metabolic Panel    Result:  06/29/2013 11:26 AM   ( Status: F )     C Sodium 139      135-145 mEq/L SLN   Potassium 4.5     3.5-5.3 mEq/L SLN   Chloride 100     96-112 mEq/L SLN   CO2 30     19-32 mEq/L SLN   Glucose 82     70-99 mg/dL SLN   BUN 57   H 1-61 mg/dL SLN   Creatinine 0.96   H 0.50-1.10 mg/dL SLN   Calcium 9.0     0.4-54.0 mg/dL SLN Assessment/Plan 1. CONGESTIVE HEART FAILURE -stable no signs of worsening heart failure; lasix was reduced last month due to worsening kidney function; will cont current medications and follow up bmp  2. HYPERTENSION -Patient is stable; continue current regimen. Will monitor and make changes as necessary.  3. Dementia arising in the senium and presenium -Patient is stable; continue current regimen.   4. VITAMIN B12 DEFICIENCY b12 is stable  5. ANEMIA-NOS Will follow up cbc  6. PERIPHERAL EDEMA Daughter reports swelling is the best it has been in a while; despite not having TED hose; will reorder TED hose today

## 2013-09-21 ENCOUNTER — Non-Acute Institutional Stay (SKILLED_NURSING_FACILITY): Payer: Medicare Other | Admitting: Nurse Practitioner

## 2013-09-21 ENCOUNTER — Encounter: Payer: Self-pay | Admitting: Nurse Practitioner

## 2013-09-21 DIAGNOSIS — I509 Heart failure, unspecified: Secondary | ICD-10-CM

## 2013-09-21 DIAGNOSIS — D649 Anemia, unspecified: Secondary | ICD-10-CM

## 2013-09-21 DIAGNOSIS — I10 Essential (primary) hypertension: Secondary | ICD-10-CM

## 2013-09-21 DIAGNOSIS — F0390 Unspecified dementia without behavioral disturbance: Secondary | ICD-10-CM

## 2013-09-21 DIAGNOSIS — F068 Other specified mental disorders due to known physiological condition: Secondary | ICD-10-CM

## 2013-09-21 DIAGNOSIS — M1990 Unspecified osteoarthritis, unspecified site: Secondary | ICD-10-CM

## 2013-09-21 DIAGNOSIS — R609 Edema, unspecified: Secondary | ICD-10-CM

## 2013-09-21 NOTE — Progress Notes (Signed)
Patient ID: [name], female   DOB: May 10, 1925, 79 y.o.   MRN: [id number]    Nursing Home Location:  [facility]   Place of Service: SNF ([phone])  PCP: Illene Regulus, MD  No Known Allergies  Chief Complaint  Patient presents with  . Medical Managment of Chronic Issues    HPI:  Patient is 79 y.o. female who has gone from retirement home living, to memory care living and now to SNF. She is a long term resident of [facility]. Pt with a pmh of dementia, arthritis, hypertension, hyperlipidemia, CHF, edema, who is being seen today for routine follow up. No concerns per nursing and pt without complaints, pt is a poor historian therefore can not contribute to ROS or HPI  Review of Systems:  Unable to obtain  Past Medical History  Diagnosis Date  . Arthritis   . Hypertension   . Hyperlipidemia   . CHF (congestive heart failure)   . Cellulitis of right leg   . Cellulitis of left leg   . Esophageal reflux   . Abnormal gait   . Dementia    Past Surgical History  Procedure Laterality Date  . Joint replacement     Social History:   reports that she has never smoked. She has never used smokeless tobacco. She reports that she does not drink alcohol or use illicit drugs.  No family history on file.  Medications: Patient's Medications  New Prescriptions   No medications on file  Previous Medications   ACETAMINOPHEN (TYLENOL) 500 MG TABLET    Take 500 mg by mouth every 12 (twelve) hours. May take additional tablet every 4 hours if needed for pain   BRIMONIDINE (ALPHAGAN P) 0.1 % SOLN    Place 1 drop into the right eye 2 (two) times daily.   CALCIUM CARBONATE (TUMS - DOSED IN MG ELEMENTAL CALCIUM) 500 MG CHEWABLE TABLET    Chew 2 tablets by mouth every morning.   CHOLECALCIFEROL (VITAMIN D) 1000 UNITS TABLET    Take 1,000 Units by mouth daily.   DIVALPROEX (DEPAKOTE SPRINKLE) 125 MG CAPSULE    Take 125 mg by mouth 3 (three) times daily.   ENALAPRIL (VASOTEC) 20 MG  TABLET    Take 20 mg by mouth every morning.   FAMOTIDINE (PEPCID) 20 MG TABLET    Take 20 mg by mouth 2 (two) times daily.   FERROUS SULFATE 325 (65 FE) MG TABLET    Take 325 mg by mouth 2 (two) times daily.   FUROSEMIDE (LASIX) 40 MG TABLET    Take 40 mg by mouth daily.    LATANOPROST (XALATAN) 0.005 % OPHTHALMIC SOLUTION    Place 1 drop into both eyes at bedtime.   LORAZEPAM (ATIVAN) 0.5 MG TABLET    Take 1 tablet (0.5 mg total) by mouth every 6 (six) hours as needed for anxiety (or agitation). Agitation   METOPROLOL SUCCINATE (TOPROL-XL) 100 MG 24 HR TABLET    Take 100 mg by mouth every morning. Take with or immediately following a meal.   VITAMIN B-12 (CYANOCOBALAMIN) 1000 MCG TABLET    Take 1,000 mcg by mouth daily.  Modified Medications   No medications on file  Discontinued Medications   No medications on file     Physical Exam:  Filed Vitals:   09/21/13 0932  BP: 142/89  Pulse: 74  Temp: 99 F (37.2 C)  Resp: 18  SpO2: 99%   Physical Exam  Vitals reviewed. Constitutional: She is  well-developed, well-nourished, and in no distress. No distress.  HENT:  Head: Normocephalic and atraumatic.  Mouth/Throat: Oropharynx is clear and moist. No oropharyngeal exudate.  Eyes: Conjunctivae and EOM are normal. Pupils are equal, round, and reactive to light.  Neck: Normal range of motion. Neck supple.  Cardiovascular: Normal rate, regular rhythm and normal heart sounds.   Pulmonary/Chest: Effort normal and breath sounds normal. No respiratory distress.  Abdominal: Soft. Bowel sounds are normal. She exhibits no distension. There is no tenderness.  Musculoskeletal: She exhibits edema (2+ to lower ext). She exhibits no tenderness.  Neurological: She is alert.  Skin: Skin is warm and dry. She is not diaphoretic.      Labs reviewed: Basic Metabolic Panel:  Recent Labs  16/10/96 1150 04/24/13 0924  NA 138 134*  K 4.0 4.6  CL 100 96  CO2 27 28  GLUCOSE 151* 106*  BUN 40* 34*   CREATININE 1.79* 1.55*  CALCIUM 9.6 9.4   Liver Function Tests:  Recent Labs  04/24/13 0924  AST 16  ALT 8  ALKPHOS 78  BILITOT 0.3  PROT 7.0  ALBUMIN 3.3*   CBC:  Recent Labs  01/03/13 1150 04/24/13 0924  WBC 9.5 7.9  NEUTROABS 6.8 5.4  HGB 9.6* 10.9*  HCT 28.5* 32.2*  MCV 95.6 95.3  PLT 329 308   Result: 07/15/2013 10:38 AM ( Status: F ) C  Sodium 138 135-145 mEq/L SLN  Potassium 4.9 3.5-5.3 mEq/L SLN  Chloride 101 96-112 mEq/L SLN  CO2 31 19-32 mEq/L SLN  Glucose 77 70-99 mg/dL SLN  BUN 60 H 0-45 mg/dL SLN  Creatinine 4.09 H 0.50-1.10 mg/dL SLN  Calcium 8.8 8.1-19.1 mg/dL SLN  Basic Metabolic Panel  Result: 07/09/2013 4:01 PM ( Status: F ) C  Sodium 138 135-145 mEq/L SLN  Potassium 4.4 3.5-5.3 mEq/L SLN  Chloride 100 96-112 mEq/L SLN  CO2 33 H 19-32 mEq/L SLN  Glucose 96 70-99 mg/dL SLN  BUN 69 H 4-78 mg/dL SLN  Creatinine 2.95 H 0.50-1.10 mg/dL SLN  Calcium 9.0 6.2-13.0 mg/dL SLN  Vitamin Q65  Result: 07/09/2013 3:45 PM ( Status: F )  Vitamin B12 1486 H 211-911 pg/mL SLN  Vitamin D (25-Hydroxy)  Result: 07/10/2013 2:37 AM ( Status: F )  Vitamin D (25-Hydroxy) 59  Basic Metabolic Panel  Result: 06/29/2013 11:26 AM ( Status: F ) C  Sodium 139 135-145 mEq/L SLN  Potassium 4.5 3.5-5.3 mEq/L SLN  Chloride 100 96-112 mEq/L SLN  CO2 30 19-32 mEq/L SLN  Glucose 82 70-99 mg/dL SLN  BUN 57 H 7-84 mg/dL SLN  Creatinine 6.96 H 0.50-1.10 mg/dL SLN  Calcium 9.0 2.9-5 08/17/13 Glucose 80 BUN 39 Cr 1.69 Potassium 4.6 Sodium 138 09/02/13 Wbc 10.5, rbc 2.96, hgb 10.1, hct 29.9,   Assessment/Plan 1. CONGESTIVE HEART FAILURE -no signs of worsening CHF; will cont to monitor   2. Dementia arising in the senium and presenium -advanced, no recent behaviors   3. OSTEOARTHRITIS -without complaints of pain  4. PERIPHERAL EDEMA -conts to be stable on current dose of lasix   5. Anemia -stable at this time; conts on iron  6. HYPERTENSION Patient is stable;  continue current regimen. Will monitor and make changes as necessary.

## 2013-11-17 ENCOUNTER — Non-Acute Institutional Stay (SKILLED_NURSING_FACILITY): Payer: Medicare Other | Admitting: Internal Medicine

## 2013-11-17 ENCOUNTER — Encounter: Payer: Self-pay | Admitting: Internal Medicine

## 2013-11-17 DIAGNOSIS — I509 Heart failure, unspecified: Secondary | ICD-10-CM

## 2013-11-17 DIAGNOSIS — N179 Acute kidney failure, unspecified: Secondary | ICD-10-CM

## 2013-11-17 DIAGNOSIS — R609 Edema, unspecified: Secondary | ICD-10-CM

## 2013-11-17 DIAGNOSIS — D649 Anemia, unspecified: Secondary | ICD-10-CM

## 2013-11-17 DIAGNOSIS — I10 Essential (primary) hypertension: Secondary | ICD-10-CM

## 2013-11-17 DIAGNOSIS — E538 Deficiency of other specified B group vitamins: Secondary | ICD-10-CM

## 2013-11-17 DIAGNOSIS — F0390 Unspecified dementia without behavioral disturbance: Secondary | ICD-10-CM

## 2013-11-17 NOTE — Assessment & Plan Note (Signed)
Pt is at baseline renal fx BUN 39/Cr1.69

## 2013-11-17 NOTE — Assessment & Plan Note (Signed)
Mild, non pitting edema-not a problem this visit

## 2013-11-17 NOTE — Assessment & Plan Note (Signed)
Pt is maintained on ACE, B Blocker and Lasix; no exacerbations. 08/2013 BUN 39/Cr 1.69 -baseline

## 2013-11-17 NOTE — Assessment & Plan Note (Addendum)
Pt's B12, Folate and ferriten are all normal; d/c B12 and iron; last H/H 10.1/29.9- stable

## 2013-11-17 NOTE — Assessment & Plan Note (Signed)
Controled on vasotec, Toprol and Lasix

## 2013-11-17 NOTE — Assessment & Plan Note (Signed)
No longer def;will d/c

## 2013-11-17 NOTE — Progress Notes (Signed)
MRN: [id number] Name: [name]  Sex: female Age: 80 y.o. DOB: 08/03/1925  PSC #: Ronni Rumble Facility/Room: 203 Level Of Care: SNF Provider: Merrilee Seashore D Emergency Contacts: Extended Emergency Contact Information Primary Emergency Contact: [facility] Address: [address]          [place], [place] [address] Darden Amber of [place] Home Phone: [phone] Mobile Phone: [phone] Relation: Daughter Secondary Emergency Contact: Crilly,Joseph Address: [address]           [place], [place] [address] Darden Amber of [place] Home Phone: [phone] Mobile Phone: [phone] Relation: Son  Code Status: DNR  Allergies: Review of patient's allergies indicates no known allergies.  Chief Complaint  Patient presents with  . Medical Managment of Chronic Issues    HPI: Patient is 80 y.o. female who is being seen for routine medical problems.  Past Medical History  Diagnosis Date  . Arthritis   . Hypertension   . Hyperlipidemia   . CHF (congestive heart failure)   . Cellulitis of right leg   . Cellulitis of left leg   . Esophageal reflux   . Abnormal gait   . Dementia     Past Surgical History  Procedure Laterality Date  . Joint replacement        Medication List       This list is accurate as of: 11/17/13  9:34 PM.  Always use your most recent med list.               acetaminophen 500 MG tablet  Commonly known as:  TYLENOL  Take 500 mg by mouth every 12 (twelve) hours. May take additional tablet every 4 hours if needed for pain     brimonidine 0.1 % Soln  Commonly known as:  ALPHAGAN P  Place 1 drop into the right eye 2 (two) times daily.     calcium carbonate 500 MG chewable tablet  Commonly known as:  TUMS - dosed in mg elemental calcium  Chew 2 tablets by mouth every morning.     cholecalciferol 1000 UNITS tablet  Commonly known as:  VITAMIN D  Take 1,000 Units by mouth daily.     divalproex 125 MG capsule  Commonly known as:  DEPAKOTE  SPRINKLE  Take 125 mg by mouth 3 (three) times daily.     enalapril 20 MG tablet  Commonly known as:  VASOTEC  Take 20 mg by mouth every morning.     famotidine 20 MG tablet  Commonly known as:  PEPCID  Take 20 mg by mouth 2 (two) times daily.     ferrous sulfate 325 (65 FE) MG tablet  Take 325 mg by mouth 2 (two) times daily.     furosemide 40 MG tablet  Commonly known as:  LASIX  Take 40 mg by mouth daily.     latanoprost 0.005 % ophthalmic solution  Commonly known as:  XALATAN  Place 1 drop into both eyes at bedtime.     LORazepam 0.5 MG tablet  Commonly known as:  ATIVAN  Take 1 tablet (0.5 mg total) by mouth every 6 (six) hours as needed for anxiety (or agitation). Agitation     metoprolol succinate 100 MG 24 hr tablet  Commonly known as:  TOPROL-XL  Take 100 mg by mouth every morning. Take with or immediately following a meal.     vitamin B-12 1000 MCG tablet  Commonly known as:  CYANOCOBALAMIN  Take 1,000 mcg by mouth daily.  No orders of the defined types were placed in this encounter.    Immunization History  Administered Date(s) Administered  . Influenza Whole 07/15/2001, 09/03/2007, 07/16/2009  . Influenza-Unspecified 07/29/2013  . Pneumococcal Polysaccharide-23 12/15/2007  . Td 12/18/2007    History  Substance Use Topics  . Smoking status: Never Smoker   . Smokeless tobacco: Never Used  . Alcohol Use: No    Review of Systems   UTO ;pt non verbal  Filed Vitals:   11/17/13 2114  BP: 128/67  Pulse: 74  Temp: 98 F (36.7 C)  Resp: 18    Physical Exam  GENERAL APPEARANCE: eyes closed but pt responds to prompts-as in she drinks when cup placed to mouth, nonconversant. Appropriately groomed. No acute distress  SKIN: No diaphoresis rash, HEENT: Unremarkable RESPIRATORY: Breathing is even, unlabored. Lung sounds are clear   CARDIOVASCULAR: Heart RRR no murmurs, rubs or gallops. Mild peripheral edema  GASTROINTESTINAL: Abdomen is soft,  non-tender, not distended w/ normal bowel sounds.  GENITOURINARY: Bladder non tender, not distended  MUSCULOSKELETAL: No abnormal joints or musculature NEUROLOGIC: Cranial nerves 2-12 grossly intact. Pt did not move her arms or legs but there are no contractures PSYCHIATRIC:  no behavioral issues  Patient Active Problem List   Diagnosis Date Noted  . Dementia without behavioral disturbance 06/05/2013  . ARF (acute renal failure) 06/09/2012  . Hyponatremia 12/20/2011  . Anemia 12/20/2011  . Cellulitis 12/20/2011  . Pedal edema 12/20/2011  . Venous stasis dermatitis 12/20/2011  . Syncope 12/19/2011  . TIA (transient ischemic attack) 12/19/2011  . CONGESTIVE HEART FAILURE 07/25/2010  . VITAMIN B12 DEFICIENCY 04/27/2010  . MIXED HEARING LOSS BILATERAL 01/17/2009  . ANEMIA-NOS 12/14/2008  . CONJUNCTIVITIS, ACUTE, BILATERAL 12/14/2008  . SINUSITIS- ACUTE-NOS 12/14/2008  . LEG CRAMPS 12/14/2008  . URINARY INCONTINENCE, MILD 12/20/2007  . SHOULDER PAIN 09/03/2007  . HYPERLIPIDEMIA 08/14/2007  . HYPERTENSION 08/14/2007  . OSTEOARTHRITIS 08/14/2007  . PERIPHERAL EDEMA 08/14/2007    CBC    Component Value Date/Time   WBC 7.9 04/24/2013 0924   RBC 3.38* 04/24/2013 0924   RBC 3.01* 12/20/2011 0340   HGB 10.9* 04/24/2013 0924   HCT 32.2* 04/24/2013 0924   PLT 308 04/24/2013 0924   MCV 95.3 04/24/2013 0924   LYMPHSABS 1.7 04/24/2013 0924   MONOABS 0.7 04/24/2013 0924   EOSABS 0.2 04/24/2013 0924   BASOSABS 0.0 04/24/2013 0924    CMP     Component Value Date/Time   NA 134* 04/24/2013 0924   K 4.6 04/24/2013 0924   CL 96 04/24/2013 0924   CO2 28 04/24/2013 0924   GLUCOSE 106* 04/24/2013 0924   BUN 34* 04/24/2013 0924   CREATININE 1.55* 04/24/2013 0924   CALCIUM 9.4 04/24/2013 0924   PROT 7.0 04/24/2013 0924   ALBUMIN 3.3* 04/24/2013 0924   AST 16 04/24/2013 0924   ALT 8 04/24/2013 0924   ALKPHOS 78 04/24/2013 0924   BILITOT 0.3 04/24/2013 0924   GFRNONAA 29* 04/24/2013 0924   GFRAA 34*  04/24/2013 0924    Assessment and Plan  CONGESTIVE HEART FAILURE Pt is maintained on ACE, B Blocker and Lasix; no exacerbations. 08/2013 BUN 39/Cr 1.69 -baseline  HYPERTENSION Controled on vasotec, Toprol and Lasix  Dementia without behavioral disturbance Pt on depakote and ativan; aricept was d/c but her sleepiness remains; actually she eats and drinks just fine, just with her eyes closed. Probable advancement of dx  ARF (acute renal failure) Pt is at baseline renal fx BUN 39/Cr1.69  Anemia  Pt's B12, Folate and ferriten are all normal; d/c B12 and iron; last H/H 10.1/29.9- stable  VITAMIN B12 DEFICIENCY No longer def;will d/c  PERIPHERAL EDEMA Mild, non pitting edema-not a problem this visit    Margit HanksALEXANDER, Hau Sanor D, MD

## 2013-11-17 NOTE — Assessment & Plan Note (Signed)
Pt on depakote and ativan; aricept was d/c but her sleepiness remains; actually she eats and drinks just fine, just with her eyes closed. Probable advancement of dx

## 2013-12-03 ENCOUNTER — Other Ambulatory Visit: Payer: Self-pay | Admitting: *Deleted

## 2013-12-03 MED ORDER — LORAZEPAM 0.5 MG PO TABS: ORAL_TABLET | ORAL | Status: AC

## 2013-12-03 NOTE — Telephone Encounter (Signed)
[facility] [place] 

## 2014-01-07 ENCOUNTER — Non-Acute Institutional Stay (SKILLED_NURSING_FACILITY): Payer: Medicare Other | Admitting: Nurse Practitioner

## 2014-01-07 ENCOUNTER — Encounter: Payer: Self-pay | Admitting: Nurse Practitioner

## 2014-01-07 DIAGNOSIS — F0390 Unspecified dementia without behavioral disturbance: Secondary | ICD-10-CM

## 2014-01-07 DIAGNOSIS — H409 Unspecified glaucoma: Secondary | ICD-10-CM | POA: Insufficient documentation

## 2014-01-07 DIAGNOSIS — I10 Essential (primary) hypertension: Secondary | ICD-10-CM

## 2014-01-07 DIAGNOSIS — K219 Gastro-esophageal reflux disease without esophagitis: Secondary | ICD-10-CM

## 2014-01-07 DIAGNOSIS — I509 Heart failure, unspecified: Secondary | ICD-10-CM

## 2014-01-07 NOTE — Progress Notes (Signed)
Patient ID: [name], female   DOB: 1925/02/01, 80 y.o.   MRN: [id number]    [facility]   Place of Service: SNF ([phone])  PCP: Illene RegulusMichael Norins, MD  No Known Allergies  Chief Complaint  Patient presents with  . Medical Managment of Chronic Issues    HPI:  Patient is 80 y.o. female who is a long term resident of [facility]. Pt with a pmh of dementia, arthritis, hypertension, hyperlipidemia, CHF, edema, who is being seen today for routine follow up. Nursing reports pt is now requiring O2 due to drop in sats, normal LOC and without shortness of breath or fevers, pt is a poor historian therefore can not contribute to ROS or HPI  Review of Systems:  Unable to obtain due to dementia   Past Medical History  Diagnosis Date  . Arthritis   . Hypertension   . Hyperlipidemia   . CHF (congestive heart failure)   . Cellulitis of right leg   . Cellulitis of left leg   . Esophageal reflux   . Abnormal gait   . Dementia    Past Surgical History  Procedure Laterality Date  . Joint replacement     Social History:   reports that she has never smoked. She has never used smokeless tobacco. She reports that she does not drink alcohol or use illicit drugs.  No family history on file.  Medications: Patient's Medications  New Prescriptions   No medications on file  Previous Medications   ACETAMINOPHEN (TYLENOL) 500 MG TABLET    Take 500 mg by mouth every 12 (twelve) hours. May take additional tablet every 4 hours if needed for pain   BRIMONIDINE (ALPHAGAN P) 0.1 % SOLN    Place 1 drop into the right eye 2 (two) times daily.   CALCIUM CARBONATE (TUMS - DOSED IN MG ELEMENTAL CALCIUM) 500 MG CHEWABLE TABLET    Chew 2 tablets by mouth every morning.   CHOLECALCIFEROL (VITAMIN D) 1000 UNITS TABLET    Take 1,000 Units by mouth daily.   DIVALPROEX (DEPAKOTE SPRINKLE) 125 MG CAPSULE    Take 125 mg by mouth 3 (three) times daily.   ENALAPRIL (VASOTEC) 20 MG  TABLET    Take 20 mg by mouth every morning.   FAMOTIDINE (PEPCID) 20 MG TABLET    Take 20 mg by mouth 2 (two) times daily.   FUROSEMIDE (LASIX) 40 MG TABLET    Take 40 mg by mouth daily.    LATANOPROST (XALATAN) 0.005 % OPHTHALMIC SOLUTION    Place 1 drop into both eyes at bedtime.   LORAZEPAM (ATIVAN) 0.5 MG TABLET    Take one tablet by mouth every 6 hours as needed for anixety   METOPROLOL SUCCINATE (TOPROL-XL) 100 MG 24 HR TABLET    Take 100 mg by mouth every morning. Take with or immediately following a meal.  Modified Medications   No medications on file  Discontinued Medications   FERROUS SULFATE 325 (65 FE) MG TABLET    Take 325 mg by mouth 2 (two) times daily.   VITAMIN B-12 (CYANOCOBALAMIN) 1000 MCG TABLET    Take 1,000 mcg by mouth daily.     Physical Exam:  Filed Vitals:   01/07/14 1529  BP: 125/74  Pulse: 83  Temp: 97.7 F (36.5 C)  Resp: 20  Weight: 166 lb (75.297 kg)  SpO2: 94%    Physical Exam  Vitals reviewed. Constitutional: She is well-developed, well-nourished, and in  no distress. No distress.  HENT:  Head: Normocephalic and atraumatic.  Mouth/Throat: Oropharynx is clear and moist. No oropharyngeal exudate.  Eyes: Conjunctivae and EOM are normal. Pupils are equal, round, and reactive to light.  Neck: Normal range of motion. Neck supple.  Cardiovascular: Normal rate, regular rhythm and normal heart sounds.   Pulmonary/Chest: Effort normal. No respiratory distress.  Diminished   Abdominal: Soft. Bowel sounds are normal. She exhibits no distension. There is no tenderness.  Musculoskeletal: She exhibits edema (stable ). She exhibits no tenderness.  Neurological:  Lethargic at time of exam however just completed therapy and activity   Skin: Skin is warm and dry. She is not diaphoretic.     Labs reviewed: Basic Metabolic Panel:  Recent Labs  78/29/56 0924  NA 134*  K 4.6  CL 96  CO2 28  GLUCOSE 106*  BUN 34*  CREATININE 1.55*  CALCIUM 9.4    Liver Function Tests:  Recent Labs  04/24/13 0924  AST 16  ALT 8  ALKPHOS 78  BILITOT 0.3  PROT 7.0  ALBUMIN 3.3*   No results found for this basename: LIPASE, AMYLASE,  in the last 8760 hours No results found for this basename: AMMONIA,  in the last 8760 hours CBC:  Recent Labs  04/24/13 0924  WBC 7.9  NEUTROABS 5.4  HGB 10.9*  HCT 32.2*  MCV 95.3  PLT 308   A1C: Lab Results  Component Value Date   HGBA1C 5.8* 12/19/2011    Result: 07/15/2013 10:38 AM ( Status: F ) C  Sodium 138 135-145 mEq/L SLN  Potassium 4.9 3.5-5.3 mEq/L SLN  Chloride 101 96-112 mEq/L SLN  CO2 31 19-32 mEq/L SLN  Glucose 77 70-99 mg/dL SLN  BUN 60 H 2-13 mg/dL SLN  Creatinine 0.86 H 0.50-1.10 mg/dL SLN  Calcium 8.8 5.7-84.6 mg/dL SLN  Basic Metabolic Panel  Result: 07/09/2013 4:01 PM ( Status: F ) C  Sodium 138 135-145 mEq/L SLN  Potassium 4.4 3.5-5.3 mEq/L SLN  Chloride 100 96-112 mEq/L SLN  CO2 33 H 19-32 mEq/L SLN  Glucose 96 70-99 mg/dL SLN  BUN 69 H 9-62 mg/dL SLN  Creatinine 9.52 H 0.50-1.10 mg/dL SLN  Calcium 9.0 8.4-13.2 mg/dL SLN  Vitamin G40  Result: 07/09/2013 3:45 PM ( Status: F )  Vitamin B12 1486 H 211-911 pg/mL SLN  Vitamin D (25-Hydroxy)  Result: 07/10/2013 2:37 AM ( Status: F )  Vitamin D (25-Hydroxy) 59  Basic Metabolic Panel  Result: 06/29/2013 11:26 AM ( Status: F ) C  Sodium 139 135-145 mEq/L SLN  Potassium 4.5 3.5-5.3 mEq/L SLN  Chloride 100 96-112 mEq/L SLN  CO2 30 19-32 mEq/L SLN  Glucose 82 70-99 mg/dL SLN  BUN 57 H 1-02 mg/dL SLN  Creatinine 7.25 H 0.50-1.10 mg/dL SLN  Calcium 9.0 3.6-6  08/17/13  Glucose 80  BUN 39  Cr 1.69  Potassium 4.6  Sodium 138  09/02/13  Wbc 10.5, rbc 2.96, hgb 10.1, hct 29.9,   Assessment/Plan 1. HYPERTENSION -remains stable on metoprolol and lisinopril  -will follow up bmp  2. CONGESTIVE HEART FAILURE -conts lasix, metoprolol  And lisinopril  -no worsening edema and pt does not appear to be short of breath but   requiring O2 due to drop in sats; will get chest xray at this time  3. Dementia without behavioral disturbance -advanced; pt not on medications to preserve memory at this time, taking Depakote sprinkles due to behaviors; no increase in behaviors noted at this time  4.  GERD (gastroesophageal reflux disease) -conts on famotidine BID   5. Glaucoma -conts on drops

## 2014-01-13 ENCOUNTER — Encounter: Payer: Self-pay | Admitting: Internal Medicine

## 2014-01-13 ENCOUNTER — Non-Acute Institutional Stay (SKILLED_NURSING_FACILITY): Payer: Medicare Other | Admitting: Internal Medicine

## 2014-01-13 DIAGNOSIS — I509 Heart failure, unspecified: Secondary | ICD-10-CM

## 2014-01-13 DIAGNOSIS — N179 Acute kidney failure, unspecified: Secondary | ICD-10-CM

## 2014-01-13 NOTE — Progress Notes (Signed)
MRN: [id number] Name: TENNIE GRUSSING  Sex: female Age: 80 y.o. DOB: 1925-02-04  PSC #: Ronni Rumble Facility/Room: 203 Level Of Care: SNF Provider: Merrilee Seashore D Emergency Contacts: Extended Emergency Contact Information Primary Emergency Contact: [facility] Address: [address]          [place], [place] [address] Darden Amber of [place] Home Phone: [phone] Mobile Phone: [phone] Relation: Daughter Secondary Emergency Contact: Dunavan,Joseph Address: [address]           [place], [place] [address] Darden Amber of [place] Home Phone: [phone] Mobile Phone: [phone] Relation: Son   Allergies: Review of patient's allergies indicates no known allergies.  Chief Complaint  Patient presents with  . Acute Visit    HPI: Patient is 80 y.o. female who nursing asked me to see because BUN and Cr are high.  Past Medical History  Diagnosis Date  . Arthritis   . Hypertension   . Hyperlipidemia   . CHF (congestive heart failure)   . Cellulitis of right leg   . Cellulitis of left leg   . Esophageal reflux   . Abnormal gait   . Dementia     Past Surgical History  Procedure Laterality Date  . Joint replacement        Medication List       This list is accurate as of: 01/13/14  7:47 PM.  Always use your most recent med list.               acetaminophen 500 MG tablet  Commonly known as:  TYLENOL  Take 500 mg by mouth every 12 (twelve) hours. May take additional tablet every 4 hours if needed for pain     brimonidine 0.1 % Soln  Commonly known as:  ALPHAGAN P  Place 1 drop into the right eye 2 (two) times daily.     calcium carbonate 500 MG chewable tablet  Commonly known as:  TUMS - dosed in mg elemental calcium  Chew 2 tablets by mouth every morning.     cholecalciferol 1000 UNITS tablet  Commonly known as:  VITAMIN D  Take 1,000 Units by mouth daily.     divalproex 125 MG capsule  Commonly known as:  DEPAKOTE SPRINKLE  Take 125 mg by mouth 3  (three) times daily.     enalapril 20 MG tablet  Commonly known as:  VASOTEC  Take 20 mg by mouth every morning.     famotidine 20 MG tablet  Commonly known as:  PEPCID  Take 20 mg by mouth 2 (two) times daily.     furosemide 40 MG tablet  Commonly known as:  LASIX  Take 40 mg by mouth daily.     latanoprost 0.005 % ophthalmic solution  Commonly known as:  XALATAN  Place 1 drop into both eyes at bedtime.     LORazepam 0.5 MG tablet  Commonly known as:  ATIVAN  Take one tablet by mouth every 6 hours as needed for anixety     metoprolol succinate 100 MG 24 hr tablet  Commonly known as:  TOPROL-XL  Take 100 mg by mouth every morning. Take with or immediately following a meal.        No orders of the defined types were placed in this encounter.    Immunization History  Administered Date(s) Administered  . Influenza Whole 07/15/2001, 09/03/2007, 07/16/2009  . Influenza-Unspecified 07/29/2013  . Pneumococcal Polysaccharide-23 12/15/2007  . Td 12/18/2007    History  Substance Use  Topics  . Smoking status: Never Smoker   . Smokeless tobacco: Never Used  . Alcohol Use: No    Review of Systems  DATA OBTAINED: UTO from pt; per nurse pt does not look different than her usual self   Filed Vitals:   01/13/14 1439  BP: 114/64  Pulse: 69  Temp: 97.9 F (36.6 C)  Resp: 18    Physical Exam  GENERAL APPEARANCE: Alert, nonconversant. Appropriately groomed. No acute distress  SKIN: No diaphoresis rash HEENT: Unremarkable RESPIRATORY: Breathing is even, unlabored. Lung sounds are clear   CARDIOVASCULAR: Heart RRR no murmurs, rubs or gallops. No peripheral edema  GASTROINTESTINAL: Abdomen is soft, non-tender, not distended w/ normal bowel sounds.  GENITOURINARY: Bladder non tender, not distended  MUSCULOSKELETAL: contractures, esp BUE; foam boots NEUROLOGIC: Cranial nerves 2-12 grossly intact; did not move anything PSYCHIATRIC: Mood and affect at baseline, no  behavioral issues  Patient Active Problem List   Diagnosis Date Noted  . GERD (gastroesophageal reflux disease) 01/07/2014  . Glaucoma 01/07/2014  . Dementia with behavioral disturbance 06/05/2013  . ARF (acute renal failure) 06/09/2012  . Hyponatremia 12/20/2011  . Anemia 12/20/2011  . Cellulitis 12/20/2011  . Pedal edema 12/20/2011  . Venous stasis dermatitis 12/20/2011  . Syncope 12/19/2011  . TIA (transient ischemic attack) 12/19/2011  . CONGESTIVE HEART FAILURE 07/25/2010  . VITAMIN B12 DEFICIENCY 04/27/2010  . ANEMIA-NOS 12/14/2008  . CONJUNCTIVITIS, ACUTE, BILATERAL 12/14/2008  . LEG CRAMPS 12/14/2008  . URINARY INCONTINENCE, MILD 12/20/2007  . SHOULDER PAIN 09/03/2007  . HYPERLIPIDEMIA 08/14/2007  . HYPERTENSION 08/14/2007  . OSTEOARTHRITIS 08/14/2007  . PERIPHERAL EDEMA 08/14/2007    CBC    Component Value Date/Time   WBC 7.9 04/24/2013 0924   RBC 3.38* 04/24/2013 0924   RBC 3.01* 12/20/2011 0340   HGB 10.9* 04/24/2013 0924   HCT 32.2* 04/24/2013 0924   PLT 308 04/24/2013 0924   MCV 95.3 04/24/2013 0924   LYMPHSABS 1.7 04/24/2013 0924   MONOABS 0.7 04/24/2013 0924   EOSABS 0.2 04/24/2013 0924   BASOSABS 0.0 04/24/2013 0924    CMP     Component Value Date/Time   NA 134* 04/24/2013 0924   K 4.6 04/24/2013 0924   CL 96 04/24/2013 0924   CO2 28 04/24/2013 0924   GLUCOSE 106* 04/24/2013 0924   BUN 34* 04/24/2013 0924   CREATININE 1.55* 04/24/2013 0924   CALCIUM 9.4 04/24/2013 0924   PROT 7.0 04/24/2013 0924   ALBUMIN 3.3* 04/24/2013 0924   AST 16 04/24/2013 0924   ALT 8 04/24/2013 0924   ALKPHOS 78 04/24/2013 0924   BILITOT 0.3 04/24/2013 0924   GFRNONAA 29* 04/24/2013 0924   GFRAA 34* 04/24/2013 0924    Assessment and Plan  ARF (acute renal failure) I first heard about pt over the phone, later reviewed new and old labs and visited pt. Pt's BUN 50 / Cr 2.28; IVF with NS at 70 cc/hr for 2 liters then recheck BMP. Stop Lasix 40 mg and restart Lasix 20 mg daily on  4/4  CONGESTIVE HEART FAILURE Pt;s BUN 50.Cr 2.28; after IV hydration with NS will start lasix back at 20 mg daily on 4/3.  time spent coordinating care in 2 phone calls and in person > 35  Lyn HollingsheadALEXANDER, Randon GoldsmithANNE D, MD

## 2014-01-13 NOTE — Assessment & Plan Note (Addendum)
I first heard about pt over the phone, later reviewed new and old labs and visited pt. Pt's BUN 50 / Cr 2.28; IVF with NS at 70 cc/hr for 2 liters then recheck BMP. Stop Lasix 40 mg and restart Lasix 20 mg daily on 4/4

## 2014-01-13 NOTE — Assessment & Plan Note (Signed)
Pt;s BUN 50.Cr 2.28; after IV hydration with NS will start lasix back at 20 mg daily on 4/3.

## 2014-03-31 ENCOUNTER — Encounter: Payer: Self-pay | Admitting: Internal Medicine

## 2014-03-31 ENCOUNTER — Non-Acute Institutional Stay (SKILLED_NURSING_FACILITY): Payer: Medicare Other | Admitting: Internal Medicine

## 2014-03-31 DIAGNOSIS — K219 Gastro-esophageal reflux disease without esophagitis: Secondary | ICD-10-CM

## 2014-03-31 DIAGNOSIS — H906 Mixed conductive and sensorineural hearing loss, bilateral: Secondary | ICD-10-CM

## 2014-03-31 DIAGNOSIS — R609 Edema, unspecified: Secondary | ICD-10-CM

## 2014-03-31 DIAGNOSIS — H409 Unspecified glaucoma: Secondary | ICD-10-CM

## 2014-03-31 DIAGNOSIS — F0390 Unspecified dementia without behavioral disturbance: Secondary | ICD-10-CM

## 2014-03-31 NOTE — Progress Notes (Signed)
Patient ID: [name], female   DOB: July 30, 1925, 80 y.o.   MRN: [id number]  [facility] Provider:  Gwenith Spitziffany L. Renato Gailseed, D.O., C.M.D.  Code Status:  DNR  Chief Complaint  Patient presents with  . Medical Management of Chronic Issues    HPI:  80 yo female long term care resident was seen for medical mgt of chronic diseases.  Her dementia continues to progress.  Staff have no specific concerns today.  Review of Systems:  Review of Systems  Unable to perform ROS: dementia    Medications: Patient's Medications  New Prescriptions   No medications on file  Previous Medications   ACETAMINOPHEN (TYLENOL) 500 MG TABLET    Take 500 mg by mouth every 12 (twelve) hours. May take additional tablet every 4 hours if needed for pain   BRIMONIDINE (ALPHAGAN P) 0.1 % SOLN    Place 1 drop into the right eye 2 (two) times daily.   CALCIUM CARBONATE (TUMS - DOSED IN MG ELEMENTAL CALCIUM) 500 MG CHEWABLE TABLET    Chew 2 tablets by mouth every morning.   CHOLECALCIFEROL (VITAMIN D) 1000 UNITS TABLET    Take 1,000 Units by mouth daily.   DIVALPROEX (DEPAKOTE SPRINKLE) 125 MG CAPSULE    Take 125 mg by mouth 3 (three) times daily.   ENALAPRIL (VASOTEC) 20 MG TABLET    Take 20 mg by mouth every morning.   FAMOTIDINE (PEPCID) 20 MG TABLET    Take 20 mg by mouth 2 (two) times daily.   FUROSEMIDE (LASIX) 20 MG TABLET    Take 20 mg by mouth daily. In the morning   LATANOPROST (XALATAN) 0.005 % OPHTHALMIC SOLUTION    Place 1 drop into both eyes at bedtime.   LORAZEPAM (ATIVAN) 0.5 MG TABLET    Take one tablet by mouth every 6 hours as needed for anixety   METOPROLOL SUCCINATE (TOPROL-XL) 100 MG 24 HR TABLET    Take 100 mg by mouth every morning. Take with or immediately following a meal.   MULTIPLE VITAMIN (MULTIVITAMIN) TABLET    Take 1 tablet by mouth daily.  Modified Medications   No medications on file  Discontinued Medications   No medications on file    Physical Exam: Filed  Vitals:   03/31/14 1208  BP: 130/67  Pulse: 66  Temp: 97.2 F (36.2 C)  Resp: 18  Height: 5\' 5"  (1.651 m)  Weight: 167 lb (75.751 kg)  Physical Exam  Constitutional:  Frail white female nad  Cardiovascular: Normal rate, regular rhythm and normal heart sounds.   Pulmonary/Chest: Effort normal and breath sounds normal.  Abdominal: Soft. Bowel sounds are normal. She exhibits no distension and no mass. There is no tenderness.  Musculoskeletal: Normal range of motion.  Neurological: She is alert.  Skin: Skin is warm and dry. There is pallor.   Labs reviewed: Basic Metabolic Panel:  Recent Labs  42/59/5606/08/28 0924  NA 134*  K 4.6  CL 96  CO2 28  GLUCOSE 106*  BUN 34*  CREATININE 1.55*  CALCIUM 9.4    Liver Function Tests:  Recent Labs  04/24/13 0924  AST 16  ALT 8  ALKPHOS 78  BILITOT 0.3  PROT 7.0  ALBUMIN 3.3*    CBC:  Recent Labs  04/24/13 0924  WBC 7.9  NEUTROABS 5.4  HGB 10.9*  HCT 32.2*  MCV 95.3  PLT 308    Assessment/Plan 1. Dementia without behavioral disturbance -progressing with more failure to  thrive and malnutrition from decreasing intake -no longer having behaviors since depakote initiated--monitor levels  2. Gastroesophageal reflux disease, esophagitis presence not specified -cont pepcid as needed, tums for the calcium  3. Glaucoma -cont drops per ophtho--poor vision does not help her cognitive state  4. MIXED HEARING LOSS BILATERAL -does not help with her cognitive state  5. PERIPHERAL EDEMA -on lasix for this, monitor renal function with poor po intake and should be held along with ace if this is ongoing for more than 1 day   Family/ staff Communication: discussed with nurse  Goals of care: long term care, DNR  Labs/tests ordered:  Cbc, bmp, depakote

## 2014-05-01 IMAGING — CT CT HEAD W/O CM
2 series · 17 of 30 positions shown, 20 images · non-contrast
Comparison: 12/19/2011

CLINICAL DATA: Altered mental status.

CT HEAD WITHOUT CONTRAST
TECHNIQUE: Contiguous axial images were obtained from the base of
the skull through the vertex without contrast.

[Series 2: head w/o · axial · non-contrast · 0.43mm/px · z∈[-145,-30]mm · 9 of 29 slices shown, 12 images]
[im 3/29  brain]
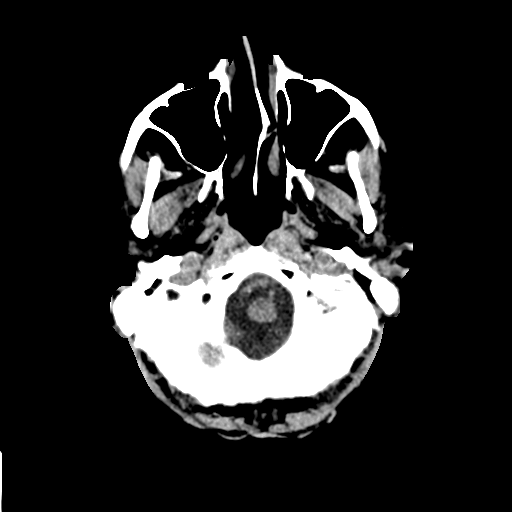
[im 3/29  bone]
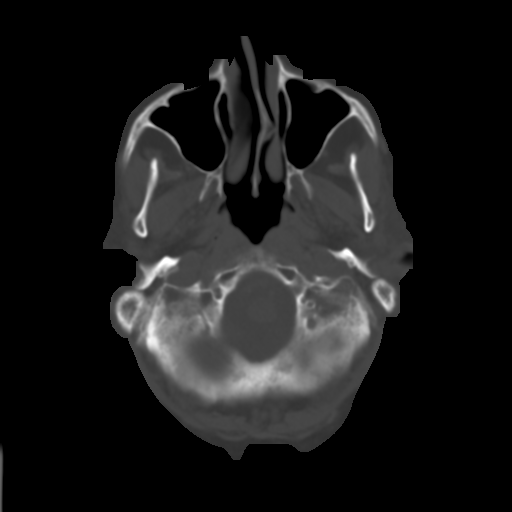
[im 6/29  brain]
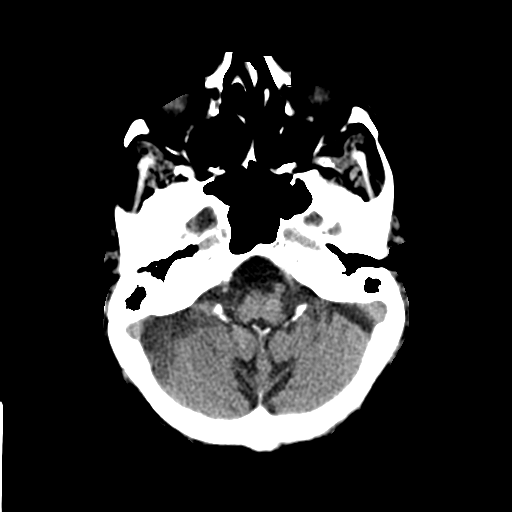
[im 9/29  brain]
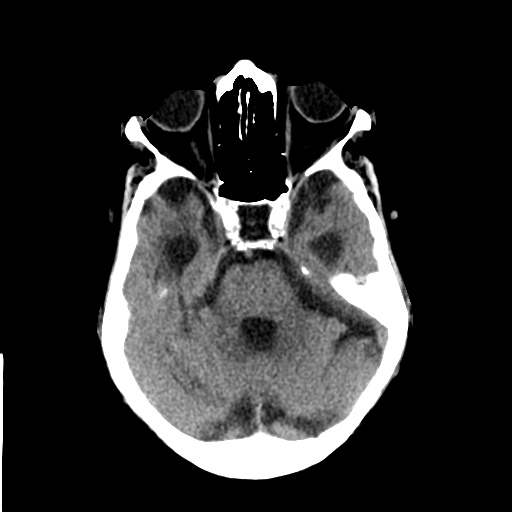
[im 12/29  brain]
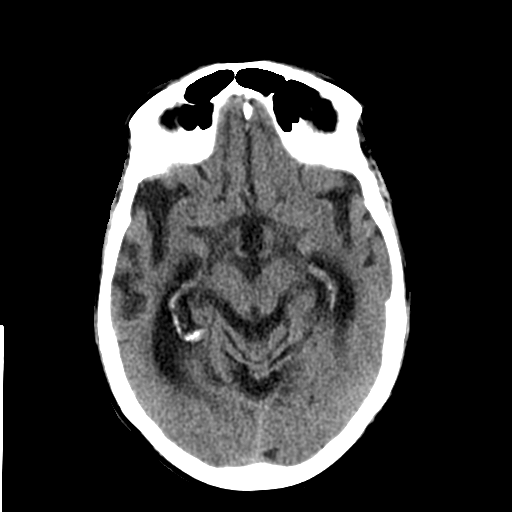
[im 15/29  brain]
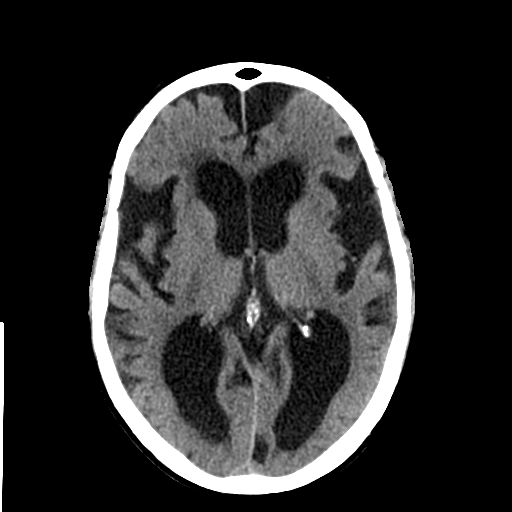
[im 15/29  bone]
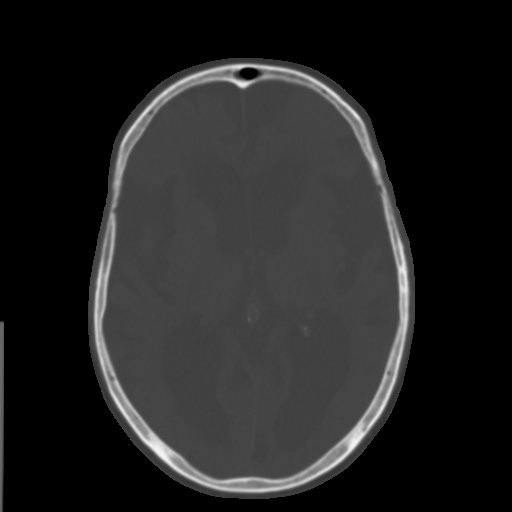
[im 17/29  brain]
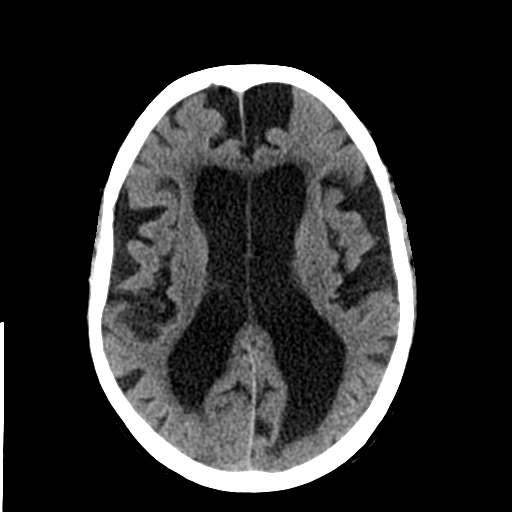
[im 20/29  brain]
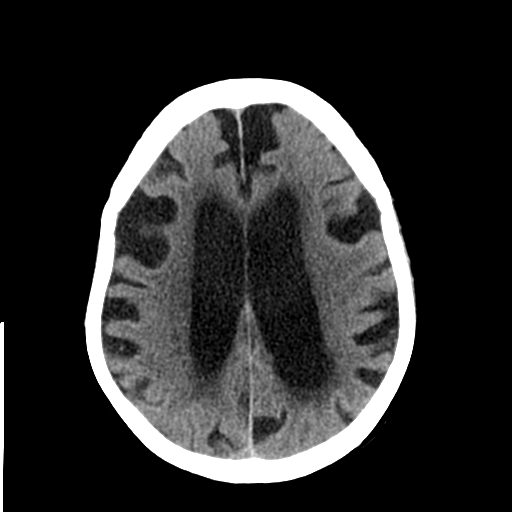
[im 23/29  brain]
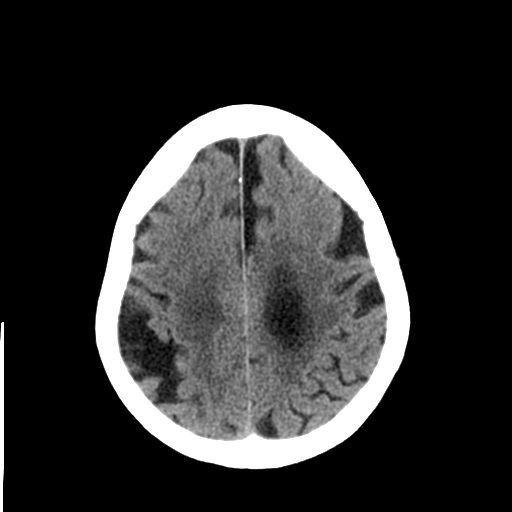
[im 26/29  brain]
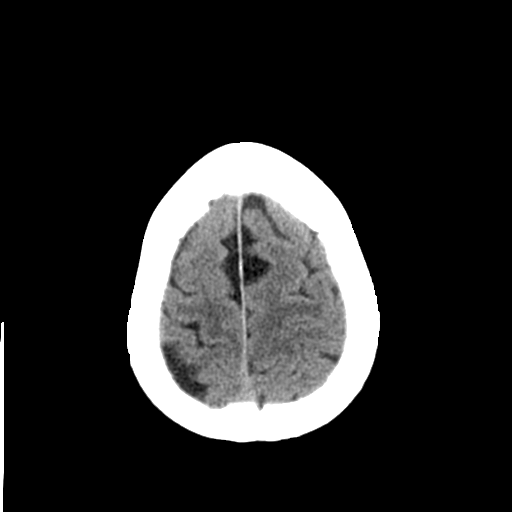
[im 26/29  bone]
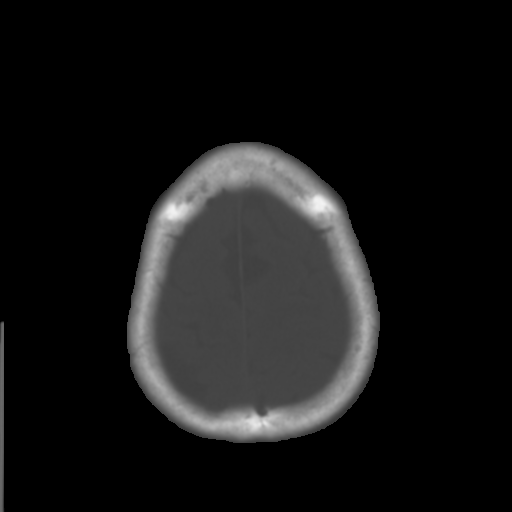

[Series 3: bone windows · axial · 0.43mm/px · z∈[-140,-32]mm · 8 of 48 slices shown]
[im 6/48  bone]
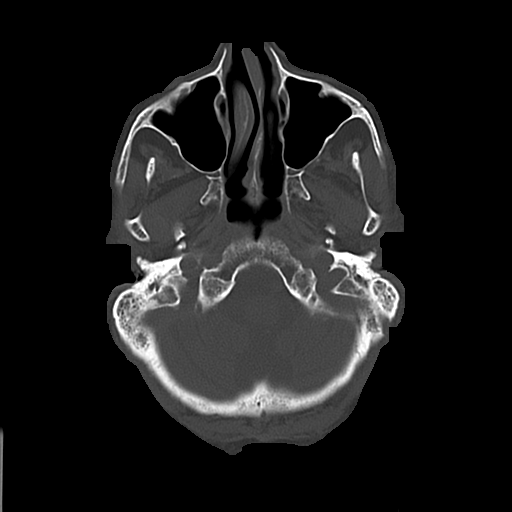
[im 11/48  bone]
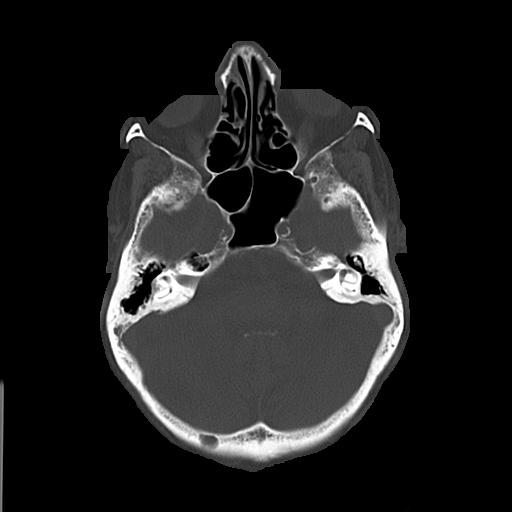
[im 16/48  bone]
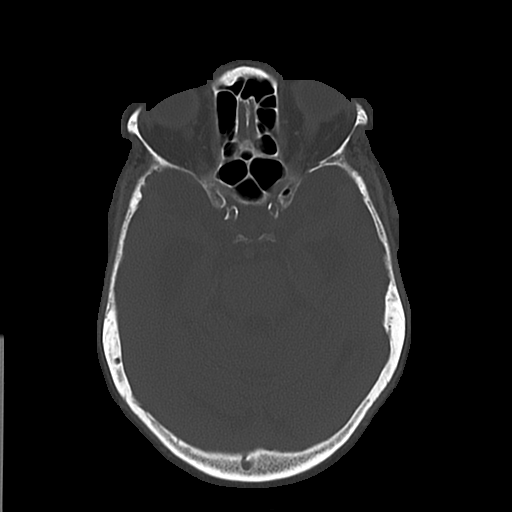
[im 21/48  bone]
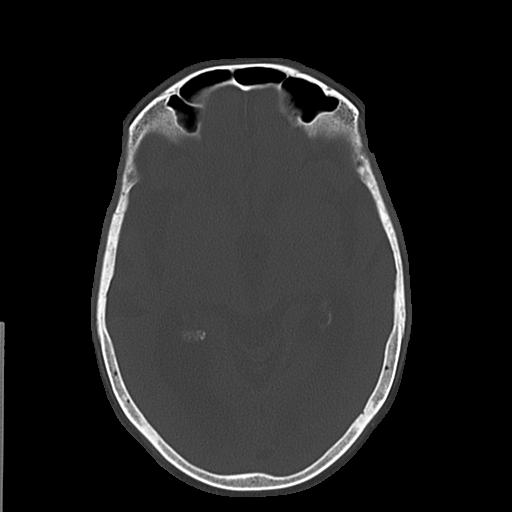
[im 27/48  bone]
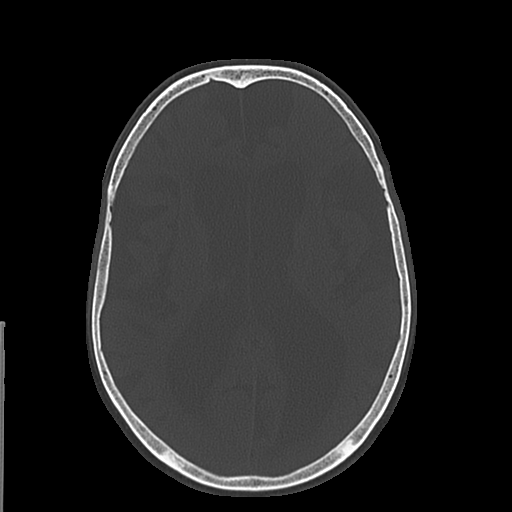
[im 32/48  bone]
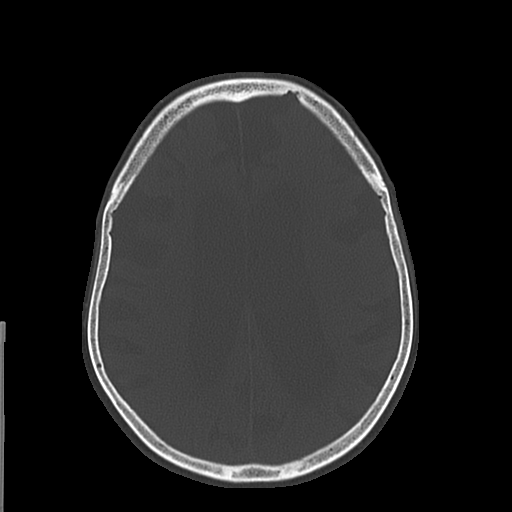
[im 37/48  bone]
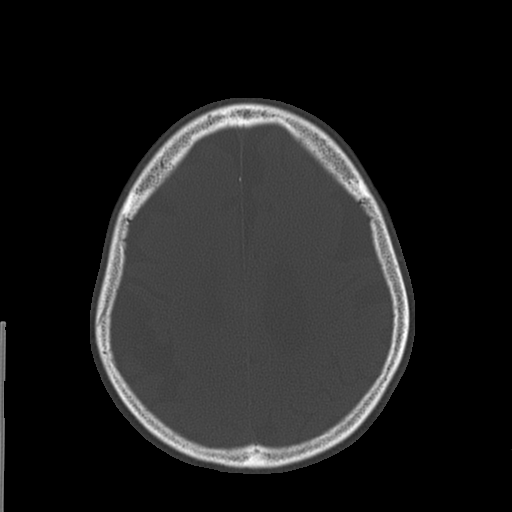
[im 42/48  bone]
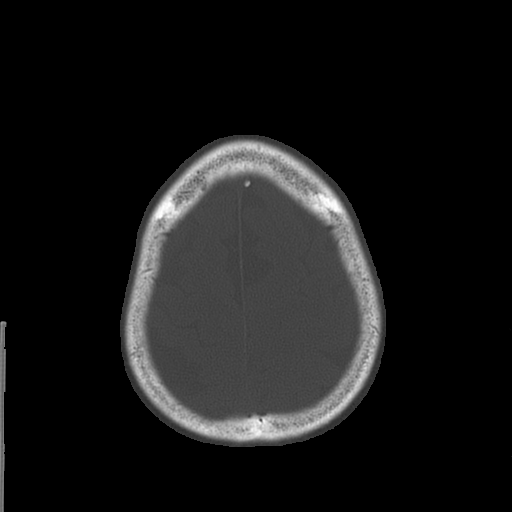

[17 of 30 positions shown; findings below may reference images not displayed]

FINDINGS: There is diffuse cerebral atrophy which is unchanged.
Again noted is a diffuse enlargement of the ventricles which
appears stable.  No evidence for acute hemorrhage, mass lesion,
midline shift, hydrocephalus or large infarct.  There is a small
infarct or insult involving the right caudate body.  There is low
density in the periventricular white matter suggesting chronic
changes.  No acute bony abnormality.
IMPRESSION: No acute intracranial abnormality.

Age indeterminate small lacunar infarct involving the right caudate
body.

Diffuse atrophy and evidence for chronic small vessel ischemic
changes.

## 2014-05-07 IMAGING — CR DG ABDOMEN ACUTE W/ 1V CHEST
1 series · 1 of 1 positions shown · non-contrast
Comparison: Pelvic and lumbar spine films of 03/30/2010.  Chest
film of 12/19/2011.

CLINICAL DATA: Near syncope.  Mid upper abdominal pain.

ACUTE ABDOMEN SERIES (ABDOMEN 2 VIEW & CHEST 1 VIEW)

[view not recorded]
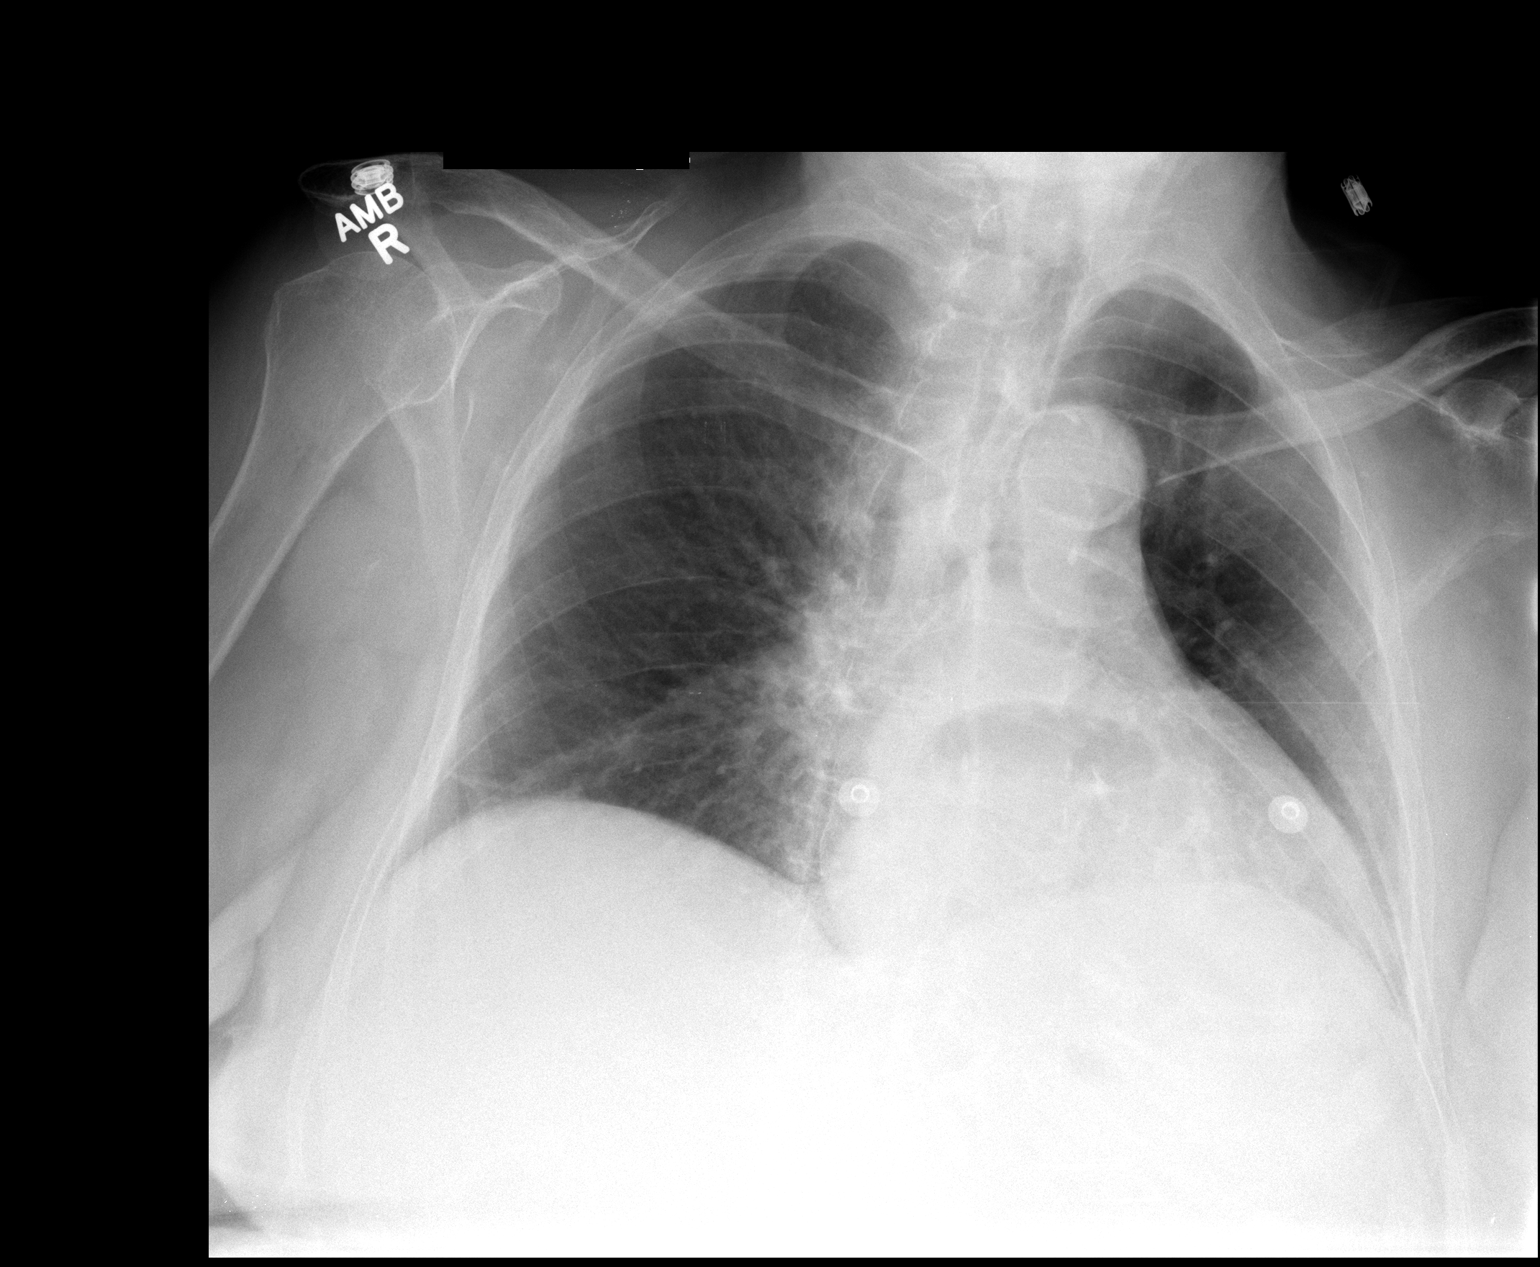

[1 of 1 positions shown; findings below may reference images not displayed]

FINDINGS: Frontal view of the chest demonstrates patient rotated to
the left.  Moderate cardiomegaly with atherosclerosis in the
transverse aorta.  Moderate to large hiatal hernia. No congestive
failure.

No pleural effusion or pneumothorax.  Patchy bibasilar scarring or
atelectasis.

Abominal films demonstrate no free intraperitoneal air or
significant air fluid levels on right side up decubitus imaging.

Supine views demonstrate no significant bowel distention.  Mild
convex left lumbar spine curvature with atherosclerosis of the
aorta.  Large amount stool in the rectum.  Moderate osteopenia.  No
abnormal abdominal calcifications.
IMPRESSION: 1. Possible constipation.
2.  Otherwise, no acute findings.
3.  Cardiomegaly and a moderate to large hiatal hernia.

## 2014-06-02 ENCOUNTER — Non-Acute Institutional Stay (SKILLED_NURSING_FACILITY): Payer: Medicare Other | Admitting: Internal Medicine

## 2014-06-02 ENCOUNTER — Encounter: Payer: Self-pay | Admitting: Internal Medicine

## 2014-06-02 DIAGNOSIS — I10 Essential (primary) hypertension: Secondary | ICD-10-CM

## 2014-06-02 DIAGNOSIS — N184 Chronic kidney disease, stage 4 (severe): Secondary | ICD-10-CM

## 2014-06-02 DIAGNOSIS — F0390 Unspecified dementia without behavioral disturbance: Secondary | ICD-10-CM

## 2014-06-02 DIAGNOSIS — I5032 Chronic diastolic (congestive) heart failure: Secondary | ICD-10-CM

## 2014-06-02 DIAGNOSIS — H409 Unspecified glaucoma: Secondary | ICD-10-CM

## 2014-06-02 DIAGNOSIS — I509 Heart failure, unspecified: Secondary | ICD-10-CM

## 2014-06-02 DIAGNOSIS — K219 Gastro-esophageal reflux disease without esophagitis: Secondary | ICD-10-CM

## 2014-06-02 NOTE — Progress Notes (Signed)
Patient ID: [name], female   DOB: September 09, 1925, 80 y.o.   MRN: [id number]  Location:  [facility] Provider:  Gwenith Spitz. Renato Gails, D.O., C.M.D.   Chief Complaint  Patient presents with  . Medical Management of Chronic Issues    HPI:  80 yo female here for long term care seen for med mgt chronic diseases.  She was sleeping in her wheelchair in her room and could not be aroused.  Staff had no concerns about her.  Review of Systems:  Review of Systems  Unable to perform ROS: dementia    Medications: Patient's Medications  New Prescriptions   No medications on file  Previous Medications   ACETAMINOPHEN (TYLENOL) 500 MG TABLET    Take 500 mg by mouth every 12 (twelve) hours. May take additional tablet every 4 hours if needed for pain   BRIMONIDINE (ALPHAGAN P) 0.1 % SOLN    Place 1 drop into the right eye 2 (two) times daily.   CALCIUM CARBONATE (TUMS - DOSED IN MG ELEMENTAL CALCIUM) 500 MG CHEWABLE TABLET    Chew 2 tablets by mouth every morning.   CHOLECALCIFEROL (VITAMIN D) 1000 UNITS TABLET    Take 1,000 Units by mouth daily.   DIVALPROEX (DEPAKOTE SPRINKLE) 125 MG CAPSULE    Take 125 mg by mouth 3 (three) times daily.   ENALAPRIL (VASOTEC) 20 MG TABLET    Take 20 mg by mouth every morning.   FAMOTIDINE (PEPCID) 20 MG TABLET    Take 20 mg by mouth 2 (two) times daily.   FUROSEMIDE (LASIX) 20 MG TABLET    Take 20 mg by mouth daily. In the morning   LATANOPROST (XALATAN) 0.005 % OPHTHALMIC SOLUTION    Place 1 drop into both eyes at bedtime.   LORAZEPAM (ATIVAN) 0.5 MG TABLET    Take one tablet by mouth every 6 hours as needed for anixety   METOPROLOL SUCCINATE (TOPROL-XL) 100 MG 24 HR TABLET    Take 100 mg by mouth every morning. Take with or immediately following a meal.   MULTIPLE VITAMIN (MULTIVITAMIN) TABLET    Take 1 tablet by mouth daily.  Modified Medications   No medications on file  Discontinued Medications   No medications on file    Physical  Exam: Filed Vitals:   06/02/14 1226  BP: 122/67  Pulse: 77  Temp: 98 F (36.7 C)  Resp: 18  Height: 5\' 5"  (1.651 m)  Weight: 154 lb 12.8 oz (70.217 kg)  SpO2: 97%  Physical Exam  Constitutional:  Obese white female resting in her wheelchair  Cardiovascular: Normal rate, regular rhythm, normal heart sounds and intact distal pulses.   Pulmonary/Chest: Effort normal and breath sounds normal. No respiratory distress.  Abdominal: Soft. Bowel sounds are normal. She exhibits no distension and no mass. There is no tenderness.  Musculoskeletal:  Wearing prevelon boots for heel protection  Neurological:  sleeping  Skin: Skin is warm and dry. There is pallor.   Assessment/Plan 1. Dementia without behavioral disturbance -did not pay attention to me today -on depakote as mood stabilizer--previous level ok (not toxic)  2. Unspecified essential hypertension -unknown if this is due to renal disease or cause of renal disease in her cause -cont ace, diuretic and beta blocker   3. Chronic diastolic CHF (congestive heart failure) -is on ace, beta blocker, diuretic; stable without acute exacerbation  4. Glaucoma -cont xalatan and alphagan drops per ophtho  5. Chronic kidney disease (CKD), stage IV (severe) -  has had h/o acute renal failure -avoid nsaids -maintain hydration -follow up bmp  6. Gastroesophageal reflux disease, esophagitis presence not specified -has as needed pepcid and tums   Family/ staff Communication: discussed with nurse Goals of care: long term care resident  Labs/tests ordered:  None today

## 2014-06-06 ENCOUNTER — Encounter: Payer: Self-pay | Admitting: Internal Medicine

## 2014-06-06 DIAGNOSIS — F0390 Unspecified dementia without behavioral disturbance: Secondary | ICD-10-CM | POA: Insufficient documentation

## 2014-06-06 DIAGNOSIS — I5032 Chronic diastolic (congestive) heart failure: Secondary | ICD-10-CM | POA: Insufficient documentation

## 2014-06-06 DIAGNOSIS — N184 Chronic kidney disease, stage 4 (severe): Secondary | ICD-10-CM | POA: Insufficient documentation

## 2014-07-26 ENCOUNTER — Non-Acute Institutional Stay (SKILLED_NURSING_FACILITY): Payer: Medicare Other | Admitting: Nurse Practitioner

## 2014-07-26 DIAGNOSIS — K219 Gastro-esophageal reflux disease without esophagitis: Secondary | ICD-10-CM

## 2014-07-26 DIAGNOSIS — M1990 Unspecified osteoarthritis, unspecified site: Secondary | ICD-10-CM

## 2014-07-26 DIAGNOSIS — N184 Chronic kidney disease, stage 4 (severe): Secondary | ICD-10-CM

## 2014-07-26 DIAGNOSIS — F03918 Unspecified dementia, unspecified severity, with other behavioral disturbance: Secondary | ICD-10-CM

## 2014-07-26 DIAGNOSIS — I5032 Chronic diastolic (congestive) heart failure: Secondary | ICD-10-CM

## 2014-07-26 DIAGNOSIS — I10 Essential (primary) hypertension: Secondary | ICD-10-CM

## 2014-07-26 DIAGNOSIS — D649 Anemia, unspecified: Secondary | ICD-10-CM

## 2014-07-26 DIAGNOSIS — F0391 Unspecified dementia with behavioral disturbance: Secondary | ICD-10-CM

## 2014-07-26 MED ORDER — DIVALPROEX SODIUM 125 MG PO CPSP: 125.0000 mg | ORAL_CAPSULE | Freq: Every day | ORAL | Status: AC

## 2014-07-26 NOTE — Progress Notes (Signed)
Patient ID: [name], female   DOB: August 30, 1925, 80 y.o.   MRN: [id number]    Nursing Home Location[facility]  [facility]   Place of Service: SNF ([phone])  PCP: Illene RegulusMichael Norins, MD  No Known Allergies  Chief Complaint  Patient presents with  . Medical Management of Chronic Issues    HPI:  Patient is 80 y.o. female who is a long term resident of [facility]. Pt with a pmh of dementia, arthritis, hypertension, hyperlipidemia, CHF, edema, who is being seen today for routine follow up. Pt with ongoing decline per nursing. Pt has had significant weight loss in the last 6 months ~10 lbs. Pt eating minimally. Per record 16% avg. During todays visit she was very lethargic and did not respond much or follow commands. Staff reports increase in sleeping.    Review of Systems:  Review of Systems  Unable to perform ROS: Dementia    Past Medical History  Diagnosis Date  . Arthritis   . Hypertension   . Hyperlipidemia   . CHF (congestive heart failure)   . Cellulitis of right leg   . Cellulitis of left leg   . Esophageal reflux   . Abnormal gait   . Dementia    Past Surgical History  Procedure Laterality Date  . Joint replacement     Social History:   reports that she has never smoked. She has never used smokeless tobacco. She reports that she does not drink alcohol or use illicit drugs.  No family history on file.  Medications: Patient's Medications  New Prescriptions   No medications on file  Previous Medications   ACETAMINOPHEN (TYLENOL) 500 MG TABLET    Take 500 mg by mouth every 12 (twelve) hours. May take additional tablet every 4 hours if needed for pain   BRIMONIDINE (ALPHAGAN P) 0.1 % SOLN    Place 1 drop into the right eye 2 (two) times daily.   CALCIUM CARBONATE (TUMS - DOSED IN MG ELEMENTAL CALCIUM) 500 MG CHEWABLE TABLET    Chew 2 tablets by mouth every morning.   CHOLECALCIFEROL (VITAMIN D) 1000 UNITS TABLET    Take 1,000 Units by mouth daily.   DIVALPROEX (DEPAKOTE SPRINKLE) 125 MG CAPSULE    Take 125 mg by mouth 3 (three) times daily.   ENALAPRIL (VASOTEC) 20 MG TABLET    Take 20 mg by mouth every morning.   FAMOTIDINE (PEPCID) 20 MG TABLET    Take 20 mg by mouth 2 (two) times daily.   FUROSEMIDE (LASIX) 20 MG TABLET    Take 20 mg by mouth daily. In the morning   LATANOPROST (XALATAN) 0.005 % OPHTHALMIC SOLUTION    Place 1 drop into both eyes at bedtime.   LORAZEPAM (ATIVAN) 0.5 MG TABLET    Take one tablet by mouth every 6 hours as needed for anixety   METOPROLOL SUCCINATE (TOPROL-XL) 100 MG 24 HR TABLET    Take 100 mg by mouth every morning. Take with or immediately following a meal.   MULTIPLE VITAMIN (MULTIVITAMIN) TABLET    Take 1 tablet by mouth daily.  Modified Medications   No medications on file  Discontinued Medications   No medications on file     Physical Exam: Filed Vitals:   07/26/14 1117  BP: 119/58  Pulse: 67  Temp: 97.2 F (36.2 C)  Resp: 19  Weight: 157 lb (71.215 kg)    Physical Exam  Constitutional: No distress.  Frail female in NAD in bed  Neck: Normal range of motion. Neck supple.  Cardiovascular: Normal rate, regular rhythm and normal heart sounds.   Pulmonary/Chest: Effort normal and breath sounds normal. No respiratory distress.  Abdominal: Soft. Bowel sounds are normal. She exhibits no distension and no mass. There is no tenderness.  Musculoskeletal: She exhibits no edema and no tenderness.  Wearing prevelon boots for heel protection  Neurological:  sleeping  Skin: Skin is warm and dry. She is not diaphoretic. There is pallor.  Psychiatric: Cognition and memory are impaired.    Labs reviewed: On chart  Assessment/Plan 1. Essential hypertension Controlled at this time   2. Dementia with behavioral disturbance -advanced, with weight loss due to worsening disease state -no behaviors noted, pt on Depakote looking back appears lethargic, possibly due to depakote, will reduce to once  daily and get level.    3. Chronic diastolic CHF (congestive heart failure) -currently on lasix 20 mg daily, no edema  4. Gastroesophageal reflux disease without esophagitis -conts on famotidine   5. Chronic kidney disease (CKD), stage IV (severe) -follow up renal function   6. Anemia, unspecified anemia type -will follow up cbc  7. Osteoarthritis, unspecified osteoarthritis type, unspecified site -without signs of pain

## 2014-07-28 ENCOUNTER — Non-Acute Institutional Stay (SKILLED_NURSING_FACILITY): Payer: Medicare Other | Admitting: Internal Medicine

## 2014-07-28 ENCOUNTER — Encounter: Payer: Self-pay | Admitting: Internal Medicine

## 2014-07-28 DIAGNOSIS — L89629 Pressure ulcer of left heel, unspecified stage: Secondary | ICD-10-CM

## 2014-07-28 DIAGNOSIS — F03918 Unspecified dementia, unspecified severity, with other behavioral disturbance: Secondary | ICD-10-CM

## 2014-07-28 DIAGNOSIS — F0391 Unspecified dementia with behavioral disturbance: Secondary | ICD-10-CM

## 2014-07-28 DIAGNOSIS — T798XXD Other early complications of trauma, subsequent encounter: Secondary | ICD-10-CM

## 2014-07-28 NOTE — Progress Notes (Signed)
Patient ID: [name], female   DOB: Mar 26, 1925, 80 y.o.   MRN: [id number]  Location: [facility] Provider:  Rexene Edison. Mariea Clonts, D.O., C.M.D.  Code Status:  DNR  Chief Complaint  Patient presents with  . Acute Visit    pressure ulcer of heel infected--was previously treated with keflex which she completed, remains lethargic and has had poor po intake, ulcer has foul odor and has not improved with abx, pt now with wbc 14.7 as of 10/13    HPI:  80 yo white female long term care resident with h/o AD, HTN, CHF seen for overall decline and above concerns.  She has completed abx with keflex for her left heel wound infection.  She has had a wbc count of 14.7 now up from 10.5 in 11/14.  She has also lost 20 lbs in 3 mos probably related to her infection and progression of her dementia.    Review of Systems:  Review of Systems  Unable to perform ROS Skin:       Left heel wound infection with foul odor  per wound care nurse and LPN   Medications: Patient's Medications  New Prescriptions   No medications on file  Previous Medications   ACETAMINOPHEN (TYLENOL) 500 MG TABLET    Take 500 mg by mouth every 12 (twelve) hours. May take additional tablet every 4 hours if needed for pain   BRIMONIDINE (ALPHAGAN P) 0.1 % SOLN    Place 1 drop into the right eye 2 (two) times daily.   CALCIUM CARBONATE (TUMS - DOSED IN MG ELEMENTAL CALCIUM) 500 MG CHEWABLE TABLET    Chew 2 tablets by mouth every morning.   CHOLECALCIFEROL (VITAMIN D) 1000 UNITS TABLET    Take 1,000 Units by mouth daily.   DIVALPROEX (DEPAKOTE SPRINKLE) 125 MG CAPSULE    Take 1 capsule (125 mg total) by mouth at bedtime.   ENALAPRIL (VASOTEC) 20 MG TABLET    Take 20 mg by mouth every morning.   FAMOTIDINE (PEPCID) 20 MG TABLET    Take 20 mg by mouth 2 (two) times daily.   FUROSEMIDE (LASIX) 20 MG TABLET    Take 20 mg by mouth daily. In the morning   LATANOPROST (XALATAN) 0.005 % OPHTHALMIC SOLUTION    Place 1 drop into  both eyes at bedtime.   LORAZEPAM (ATIVAN) 0.5 MG TABLET    Take one tablet by mouth every 6 hours as needed for anixety   METOPROLOL SUCCINATE (TOPROL-XL) 100 MG 24 HR TABLET    Take 100 mg by mouth every morning. Take with or immediately following a meal.   MULTIPLE VITAMIN (MULTIVITAMIN) TABLET    Take 1 tablet by mouth daily.  Modified Medications   No medications on file  Discontinued Medications   No medications on file    Physical Exam: Filed Vitals:   07/28/14 1551  BP: 121/78  Pulse: 75  Temp: 97.4 F (36.3 C)  Resp: 16  Height: _0  (1.651 m)  Weight: 140 lb (63.504 kg)  SpO2: 96%  Physical Exam  Constitutional: No distress.  Was arousable today, but did not speak  Cardiovascular: Normal rate, regular rhythm, normal heart sounds and intact distal pulses.   Pulmonary/Chest: Effort normal and breath sounds normal.  Abdominal: Soft. Bowel sounds are normal. She exhibits no distension and no mass. There is no tenderness.  Musculoskeletal:  Is wearing prevelon boots  Skin:  Right heel with foul odor coming from it even with  clean dressing that had just been applied    Labs reviewed: 07/27/14:  Wbc 14.7, h/h 10.3/34, plts 412, left shift; glucose 87, BUN 38.6, cr 1.64, liver panel normal except alb 3.3, depakote level 17, vit D 47.98  Assessment/Plan 1. Pressure ulcer of heel, left, unspecified pressure ulcer stage -being managed by treatment nurse and wound care physician -cont dressings and prevelon boots for offloading of pressure  2. Wound infection, subsequent encounter -will r/o osteomyelitis due to persistence, odor, drainage, wbc and check xrays and ESR -take doxycycline 186m po bid for 10 days  3. Dementia with behavioral disturbance -continues to progress, has been losing weight, has had problems with behaviors that required depakote sprinkles and ativan use -cont supplements and assistance with meals  Family/ staff Communication: discussed with her  nurse  Goals of care: DNR, long term care resident  Labs/tests ordered:  CXR, ESR, left heel xrays

## 2014-08-22 ENCOUNTER — Non-Acute Institutional Stay (SKILLED_NURSING_FACILITY): Payer: Medicare Other | Admitting: Internal Medicine

## 2014-08-22 DIAGNOSIS — Z710 Person encountering health services to consult on behalf of another person: Secondary | ICD-10-CM

## 2014-08-22 DIAGNOSIS — I10 Essential (primary) hypertension: Secondary | ICD-10-CM

## 2014-08-22 DIAGNOSIS — L8990 Pressure ulcer of unspecified site, unspecified stage: Secondary | ICD-10-CM

## 2014-08-22 DIAGNOSIS — N184 Chronic kidney disease, stage 4 (severe): Secondary | ICD-10-CM

## 2014-08-22 DIAGNOSIS — I5032 Chronic diastolic (congestive) heart failure: Secondary | ICD-10-CM

## 2014-08-22 DIAGNOSIS — F0390 Unspecified dementia without behavioral disturbance: Secondary | ICD-10-CM

## 2014-08-22 NOTE — Progress Notes (Signed)
MRN: [id number] Name: [name]  Sex: female Age: 80 y.o. DOB: 11-08-1924  [place] #: [name] Facility/Room:203A Level Of Care: SNF Provider: Inocencio Homes D Emergency Contacts: Extended Emergency Contact Information Primary Emergency Contact: [facility] Address: [place], [place] [address] [name] of [place] Phone: [phone] Mobile Phone: [phone] Relation: Daughter Secondary Emergency Contact: [name] Address: [address]           [place], [place] [address] [name] of [place] Phone: [phone] Mobile Phone: [phone] Relation: Son  Code Status: DNR  Allergies: Review of patient's allergies indicates no known allergies.  Chief Complaint  Patient presents with  . family conferenc without patient present    HPI: Patient is 80 y.o. female whose family I am meeting with today regarding pt's decline.  Past Medical History  Diagnosis Date  . Arthritis   . Hypertension   . Hyperlipidemia   . CHF (congestive heart failure)   . Cellulitis of right leg   . Cellulitis of left leg   . Esophageal reflux   . Abnormal gait   . Dementia     Past Surgical History  Procedure Laterality Date  . Joint replacement        Medication List       This list is accurate as of: 08/22/14 11:59 PM.  Always use your most recent med list.               acetaminophen 500 MG tablet  Commonly known as:  TYLENOL  Take 500 mg by mouth every 12 (twelve) hours. May take additional tablet every 4 hours if needed for pain     brimonidine 0.1 % Soln  Commonly known as:  ALPHAGAN P  Place 1 drop into the right eye 2 (two) times daily.     calcium carbonate 500 MG chewable tablet  Commonly known as:  TUMS - dosed in mg elemental calcium  Chew 2 tablets by mouth every morning.     cholecalciferol 1000 UNITS tablet  Commonly known as:  VITAMIN D  Take 1,000 Units by mouth daily.     divalproex 125 MG capsule  Commonly  known as:  DEPAKOTE SPRINKLE  Take 1 capsule (125 mg total) by mouth at bedtime.     enalapril 20 MG tablet  Commonly known as:  VASOTEC  Take 20 mg by mouth every morning.     famotidine 20 MG tablet  Commonly known as:  PEPCID  Take 20 mg by mouth 2 (two) times daily.     furosemide 20 MG tablet  Commonly known as:  LASIX  Take 20 mg by mouth daily. In the morning     latanoprost 0.005 % ophthalmic solution  Commonly known as:  XALATAN  Place 1 drop into both eyes at bedtime.     LORazepam 0.5 MG tablet  Commonly known as:  ATIVAN  Take one tablet by mouth every 6 hours as needed for anixety     metoprolol succinate 100 MG 24 hr tablet  Commonly known as:  TOPROL-XL  Take 100 mg by mouth every morning. Take with or immediately following a meal.     multivitamin tablet  Take 1 tablet by mouth daily.        No orders of the defined types were placed in this encounter.    Immunization History  Administered Date(s) Administered  . Influenza Whole 07/15/2001, 09/03/2007, 07/16/2009  .  Influenza-Unspecified 07/29/2013, 07/26/2014  . Pneumococcal Polysaccharide-23 12/15/2007  . Td 12/18/2007    History  Substance Use Topics  . Smoking status: Never Smoker   . Smokeless tobacco: Never Used  . Alcohol Use: No    Review of Systems   UTO   Filed Vitals:   08/22/14 2110  BP: 121/65  Pulse: 75  Temp: 97.8 F (36.6 C)  Resp: 16    Physical Exam  GENERAL APPEARANCE: sleeping, No acute distress  SKIN: No diaphoresis rash, wounds dressed HEENT: Unremarkable RESPIRATORY: Breathing is even, unlabored. Lung sounds are clear   CARDIOVASCULAR: Heart RRR no murmurs, rubs or gallops. No peripheral edema  GASTROINTESTINAL: Abdomen is soft, non-tender, not distended w/ normal bowel sounds.  GENITOURINARY: Bladder non tender, not distended  MUSCULOSKELETAL: No abnormal joints or musculature NEUROLOGIC: Cranial nerves 2-12 grossly intact PSYCHIATRIC: unable to  assess  Patient Active Problem List   Diagnosis Date Noted  . Encounter for family conference without patient present 08/29/2014  . Decubitus ulcers 08/29/2014  . Chronic kidney disease (CKD), stage IV (severe) 06/06/2014  . Chronic diastolic CHF (congestive heart failure) 06/06/2014  . Dementia without behavioral disturbance 06/06/2014  . GERD (gastroesophageal reflux disease) 01/07/2014  . Glaucoma 01/07/2014  . Dementia with behavioral disturbance 06/05/2013  . Hyponatremia 12/20/2011  . Cellulitis 12/20/2011  . Venous stasis dermatitis 12/20/2011  . Syncope 12/19/2011  . TIA (transient ischemic attack) 12/19/2011  . VITAMIN B12 DEFICIENCY 04/27/2010  . Anemia 12/14/2008  . CONJUNCTIVITIS, ACUTE, BILATERAL 12/14/2008  . LEG CRAMPS 12/14/2008  . URINARY INCONTINENCE, MILD 12/20/2007  . SHOULDER PAIN 09/03/2007  . HYPERLIPIDEMIA 08/14/2007  . Essential hypertension 08/14/2007  . Osteoarthritis 08/14/2007  . PERIPHERAL EDEMA 08/14/2007    CBC    Component Value Date/Time   WBC 7.9 04/24/2013 0924   RBC 3.38* 04/24/2013 0924   RBC 3.01* 12/20/2011 0340   HGB 10.9* 04/24/2013 0924   HCT 32.2* 04/24/2013 0924   PLT 308 04/24/2013 0924   MCV 95.3 04/24/2013 0924   LYMPHSABS 1.7 04/24/2013 0924   MONOABS 0.7 04/24/2013 0924   EOSABS 0.2 04/24/2013 0924   BASOSABS 0.0 04/24/2013 0924    CMP     Component Value Date/Time   NA 134* 04/24/2013 0924   K 4.6 04/24/2013 0924   CL 96 04/24/2013 0924   CO2 28 04/24/2013 0924   GLUCOSE 106* 04/24/2013 0924   BUN 34* 04/24/2013 0924   CREATININE 1.55* 04/24/2013 0924   CALCIUM 9.4 04/24/2013 0924   PROT 7.0 04/24/2013 0924   ALBUMIN 3.3* 04/24/2013 0924   AST 16 04/24/2013 0924   ALT 8 04/24/2013 0924   ALKPHOS 78 04/24/2013 0924   BILITOT 0.3 04/24/2013 0924   GFRNONAA 29* 04/24/2013 0924   GFRAA 34* 04/24/2013 0924    Assessment and Plan  Encounter for family conference without patient present Met with family  with SW present. Daughter well acquainted with Hospice. We discussed pt's precipitous decline and family's wishes for her. They would like a hospice consult. They do not wish pt to go to hospital, pt will be DNR and comfort care is goal.  Chronic diastolic CHF (congestive heart failure) Noted  Essential hypertension Noted  Dementia without behavioral disturbance Noted  Chronic kidney disease (CKD), stage IV (severe) Noted  Decubitus ulcers Noted  Time spent with pt's chart, nursing, pt and family > 45 min. Date of service 08/17/2014 Hennie Duos, MD

## 2014-08-29 ENCOUNTER — Encounter: Payer: Self-pay | Admitting: Internal Medicine

## 2014-08-29 DIAGNOSIS — Z710 Person encountering health services to consult on behalf of another person: Secondary | ICD-10-CM | POA: Insufficient documentation

## 2014-08-29 DIAGNOSIS — L8990 Pressure ulcer of unspecified site, unspecified stage: Secondary | ICD-10-CM | POA: Insufficient documentation

## 2014-08-29 NOTE — Assessment & Plan Note (Signed)
Noted  

## 2014-08-29 NOTE — Assessment & Plan Note (Signed)
Met with family with SW present. Daughter well acquainted with Hospice. We discussed pt's precipitous decline and family's wishes for her. They would like a hospice consult. They do not wish pt to go to hospital, pt will be DNR and comfort care is goal.

## 2014-09-14 DEATH — deceased
